# Patient Record
Sex: Female | Born: 1970 | Race: White | Hispanic: No | Marital: Married | Smoking: Never smoker
Health system: Southern US, Community
[De-identification: ages and names within clinical notes are randomized; demographics above are authoritative.]

## PROBLEM LIST (undated history)

## (undated) VITALS — BP 106/76 | HR 91 | Temp 98.3°F | Resp 18 | Ht 63.0 in | Wt 199.0 lb

## (undated) DIAGNOSIS — Z9889 Other specified postprocedural states: Secondary | ICD-10-CM

## (undated) DIAGNOSIS — F32A Depression, unspecified: Secondary | ICD-10-CM

## (undated) DIAGNOSIS — F329 Major depressive disorder, single episode, unspecified: Secondary | ICD-10-CM

## (undated) DIAGNOSIS — K219 Gastro-esophageal reflux disease without esophagitis: Secondary | ICD-10-CM

## (undated) DIAGNOSIS — R112 Nausea with vomiting, unspecified: Secondary | ICD-10-CM

## (undated) DIAGNOSIS — D649 Anemia, unspecified: Secondary | ICD-10-CM

## (undated) DIAGNOSIS — G43909 Migraine, unspecified, not intractable, without status migrainosus: Secondary | ICD-10-CM

## (undated) DIAGNOSIS — J329 Chronic sinusitis, unspecified: Secondary | ICD-10-CM

## (undated) DIAGNOSIS — C44509 Unspecified malignant neoplasm of skin of other part of trunk: Secondary | ICD-10-CM

## (undated) HISTORY — DX: Depression, unspecified: F32.A

## (undated) HISTORY — DX: Anemia, unspecified: D64.9

## (undated) HISTORY — DX: Migraine, unspecified, not intractable, without status migrainosus: G43.909

## (undated) HISTORY — DX: Gastro-esophageal reflux disease without esophagitis: K21.9

## (undated) HISTORY — PX: OTHER SURGICAL HISTORY: SHX169

## (undated) HISTORY — DX: Major depressive disorder, single episode, unspecified: F32.9

## (undated) HISTORY — DX: Unspecified malignant neoplasm of skin of other part of trunk: C44.509

## (undated) HISTORY — DX: Chronic sinusitis, unspecified: J32.9

---

## 1999-02-16 ENCOUNTER — Other Ambulatory Visit: Admission: RE | Admit: 1999-02-16 | Discharge: 1999-02-16 | Payer: Self-pay | Admitting: Obstetrics and Gynecology

## 1999-10-17 HISTORY — PX: CHOLECYSTECTOMY: SHX55

## 2000-05-24 ENCOUNTER — Other Ambulatory Visit: Admission: RE | Admit: 2000-05-24 | Discharge: 2000-05-24 | Payer: Self-pay | Admitting: Surgery

## 2000-05-24 ENCOUNTER — Encounter (INDEPENDENT_AMBULATORY_CARE_PROVIDER_SITE_OTHER): Payer: Self-pay | Admitting: Specialist

## 2000-07-25 ENCOUNTER — Encounter: Payer: Self-pay | Admitting: Obstetrics and Gynecology

## 2000-07-25 ENCOUNTER — Ambulatory Visit (HOSPITAL_COMMUNITY): Admission: RE | Admit: 2000-07-25 | Discharge: 2000-07-25 | Payer: Self-pay | Admitting: Obstetrics and Gynecology

## 2000-08-30 ENCOUNTER — Encounter: Payer: Self-pay | Admitting: Gastroenterology

## 2000-08-30 ENCOUNTER — Ambulatory Visit (HOSPITAL_COMMUNITY): Admission: RE | Admit: 2000-08-30 | Discharge: 2000-08-30 | Payer: Self-pay | Admitting: Gastroenterology

## 2001-01-08 ENCOUNTER — Encounter: Payer: Self-pay | Admitting: Obstetrics and Gynecology

## 2001-01-08 ENCOUNTER — Ambulatory Visit (HOSPITAL_COMMUNITY): Admission: RE | Admit: 2001-01-08 | Discharge: 2001-01-08 | Payer: Self-pay | Admitting: Obstetrics and Gynecology

## 2001-01-10 ENCOUNTER — Ambulatory Visit (HOSPITAL_COMMUNITY): Admission: RE | Admit: 2001-01-10 | Discharge: 2001-01-10 | Payer: Self-pay | Admitting: Obstetrics and Gynecology

## 2001-01-22 ENCOUNTER — Encounter: Payer: Self-pay | Admitting: Obstetrics and Gynecology

## 2001-01-22 ENCOUNTER — Ambulatory Visit (HOSPITAL_COMMUNITY): Admission: RE | Admit: 2001-01-22 | Discharge: 2001-01-22 | Payer: Self-pay | Admitting: Obstetrics and Gynecology

## 2001-01-23 ENCOUNTER — Encounter (INDEPENDENT_AMBULATORY_CARE_PROVIDER_SITE_OTHER): Payer: Self-pay

## 2001-01-23 ENCOUNTER — Ambulatory Visit (HOSPITAL_COMMUNITY): Admission: RE | Admit: 2001-01-23 | Discharge: 2001-01-23 | Payer: Self-pay | Admitting: Obstetrics and Gynecology

## 2001-08-09 ENCOUNTER — Encounter: Payer: Self-pay | Admitting: Obstetrics and Gynecology

## 2001-08-09 ENCOUNTER — Ambulatory Visit (HOSPITAL_COMMUNITY): Admission: RE | Admit: 2001-08-09 | Discharge: 2001-08-09 | Payer: Self-pay | Admitting: Obstetrics and Gynecology

## 2001-08-26 ENCOUNTER — Encounter: Payer: Self-pay | Admitting: Obstetrics and Gynecology

## 2001-08-26 ENCOUNTER — Ambulatory Visit (HOSPITAL_COMMUNITY): Admission: RE | Admit: 2001-08-26 | Discharge: 2001-08-26 | Payer: Self-pay | Admitting: Obstetrics and Gynecology

## 2001-08-28 ENCOUNTER — Ambulatory Visit (HOSPITAL_COMMUNITY): Admission: RE | Admit: 2001-08-28 | Discharge: 2001-08-28 | Payer: Self-pay | Admitting: Obstetrics and Gynecology

## 2001-08-28 ENCOUNTER — Encounter: Payer: Self-pay | Admitting: Obstetrics and Gynecology

## 2001-08-28 ENCOUNTER — Encounter (INDEPENDENT_AMBULATORY_CARE_PROVIDER_SITE_OTHER): Payer: Self-pay | Admitting: Specialist

## 2001-11-29 ENCOUNTER — Ambulatory Visit (HOSPITAL_COMMUNITY): Admission: RE | Admit: 2001-11-29 | Discharge: 2001-11-29 | Payer: Self-pay

## 2004-04-06 ENCOUNTER — Other Ambulatory Visit: Admission: RE | Admit: 2004-04-06 | Discharge: 2004-04-06 | Payer: Self-pay | Admitting: Obstetrics and Gynecology

## 2004-04-15 ENCOUNTER — Ambulatory Visit (HOSPITAL_COMMUNITY): Admission: RE | Admit: 2004-04-15 | Discharge: 2004-04-15 | Payer: Self-pay | Admitting: Obstetrics and Gynecology

## 2004-04-15 ENCOUNTER — Encounter (INDEPENDENT_AMBULATORY_CARE_PROVIDER_SITE_OTHER): Payer: Self-pay | Admitting: Specialist

## 2005-04-04 ENCOUNTER — Other Ambulatory Visit: Admission: RE | Admit: 2005-04-04 | Discharge: 2005-04-04 | Payer: Self-pay | Admitting: Obstetrics and Gynecology

## 2006-12-13 ENCOUNTER — Emergency Department (HOSPITAL_COMMUNITY): Admission: EM | Admit: 2006-12-13 | Discharge: 2006-12-13 | Payer: Self-pay | Admitting: Emergency Medicine

## 2006-12-24 ENCOUNTER — Ambulatory Visit: Payer: Self-pay | Admitting: Internal Medicine

## 2006-12-24 LAB — CONVERTED CEMR LAB: Potassium: 3.2 meq/L — ABNORMAL LOW (ref 3.5–5.1)

## 2007-02-15 ENCOUNTER — Ambulatory Visit: Payer: Self-pay | Admitting: Internal Medicine

## 2007-02-15 LAB — CONVERTED CEMR LAB
BUN: 4 mg/dL — ABNORMAL LOW (ref 6–23)
Creatinine, Ser: 0.6 mg/dL (ref 0.4–1.2)
Potassium: 3.1 meq/L — ABNORMAL LOW (ref 3.5–5.1)

## 2007-02-20 ENCOUNTER — Ambulatory Visit: Payer: Self-pay | Admitting: Internal Medicine

## 2007-02-20 LAB — CONVERTED CEMR LAB
PRA: 2.6
Potassium Urine: 57 mmol/L
Potassium: 3.7 meq/L (ref 3.5–5.1)

## 2007-03-14 ENCOUNTER — Encounter: Payer: Self-pay | Admitting: Internal Medicine

## 2007-03-14 ENCOUNTER — Ambulatory Visit: Payer: Self-pay | Admitting: Internal Medicine

## 2007-04-08 ENCOUNTER — Other Ambulatory Visit: Admission: RE | Admit: 2007-04-08 | Discharge: 2007-04-08 | Payer: Self-pay | Admitting: Obstetrics and Gynecology

## 2007-04-11 ENCOUNTER — Ambulatory Visit: Payer: Self-pay | Admitting: Gastroenterology

## 2007-04-12 ENCOUNTER — Encounter: Payer: Self-pay | Admitting: Internal Medicine

## 2007-04-12 ENCOUNTER — Ambulatory Visit: Payer: Self-pay | Admitting: Gastroenterology

## 2007-04-15 LAB — CONVERTED CEMR LAB
Amylase: 56 units/L (ref 27–131)
Bilirubin, Direct: 0.1 mg/dL (ref 0.0–0.3)
Eosinophils Absolute: 0.1 10*3/uL (ref 0.0–0.6)
Eosinophils Relative: 1 % (ref 0.0–5.0)
HCT: 38.5 % (ref 36.0–46.0)
Hemoglobin: 12.8 g/dL (ref 12.0–15.0)
MCV: 82.5 fL (ref 78.0–100.0)
Monocytes Relative: 6 % (ref 3.0–11.0)
Neutro Abs: 6 10*3/uL (ref 1.4–7.7)
Neutrophils Relative %: 69.1 % (ref 43.0–77.0)

## 2007-04-16 HISTORY — PX: ESOPHAGEAL DILATION: SHX303

## 2007-04-17 ENCOUNTER — Ambulatory Visit: Payer: Self-pay | Admitting: Internal Medicine

## 2007-05-06 ENCOUNTER — Ambulatory Visit (HOSPITAL_COMMUNITY): Admission: RE | Admit: 2007-05-06 | Discharge: 2007-05-06 | Payer: Self-pay | Admitting: Gastroenterology

## 2007-05-15 ENCOUNTER — Ambulatory Visit: Payer: Self-pay | Admitting: Gastroenterology

## 2007-08-12 ENCOUNTER — Ambulatory Visit: Payer: Self-pay | Admitting: Internal Medicine

## 2007-08-12 DIAGNOSIS — G43909 Migraine, unspecified, not intractable, without status migrainosus: Secondary | ICD-10-CM | POA: Insufficient documentation

## 2007-08-13 ENCOUNTER — Encounter (INDEPENDENT_AMBULATORY_CARE_PROVIDER_SITE_OTHER): Payer: Self-pay | Admitting: *Deleted

## 2007-08-13 LAB — CONVERTED CEMR LAB: Potassium: 3.8 meq/L (ref 3.5–5.1)

## 2007-08-27 ENCOUNTER — Ambulatory Visit: Payer: Self-pay | Admitting: Internal Medicine

## 2008-01-27 ENCOUNTER — Ambulatory Visit: Payer: Self-pay | Admitting: Internal Medicine

## 2008-01-27 DIAGNOSIS — K219 Gastro-esophageal reflux disease without esophagitis: Secondary | ICD-10-CM | POA: Insufficient documentation

## 2008-01-27 DIAGNOSIS — F329 Major depressive disorder, single episode, unspecified: Secondary | ICD-10-CM | POA: Insufficient documentation

## 2008-01-27 DIAGNOSIS — F3289 Other specified depressive episodes: Secondary | ICD-10-CM | POA: Insufficient documentation

## 2008-01-28 DIAGNOSIS — K222 Esophageal obstruction: Secondary | ICD-10-CM | POA: Insufficient documentation

## 2008-01-28 DIAGNOSIS — K3189 Other diseases of stomach and duodenum: Secondary | ICD-10-CM | POA: Insufficient documentation

## 2008-01-28 DIAGNOSIS — D1803 Hemangioma of intra-abdominal structures: Secondary | ICD-10-CM | POA: Insufficient documentation

## 2008-01-28 DIAGNOSIS — R1013 Epigastric pain: Secondary | ICD-10-CM

## 2008-01-30 ENCOUNTER — Encounter (INDEPENDENT_AMBULATORY_CARE_PROVIDER_SITE_OTHER): Payer: Self-pay | Admitting: *Deleted

## 2008-02-08 ENCOUNTER — Emergency Department (HOSPITAL_COMMUNITY): Admission: EM | Admit: 2008-02-08 | Discharge: 2008-02-09 | Payer: Self-pay | Admitting: Emergency Medicine

## 2008-02-12 ENCOUNTER — Ambulatory Visit: Payer: Self-pay | Admitting: Internal Medicine

## 2008-02-17 ENCOUNTER — Encounter: Payer: Self-pay | Admitting: Internal Medicine

## 2008-02-17 LAB — CONVERTED CEMR LAB
BUN: 9 mg/dL (ref 6–23)
Creatinine, Ser: 0.7 mg/dL (ref 0.4–1.2)

## 2008-02-19 ENCOUNTER — Encounter (INDEPENDENT_AMBULATORY_CARE_PROVIDER_SITE_OTHER): Payer: Self-pay | Admitting: *Deleted

## 2008-02-19 LAB — CONVERTED CEMR LAB: Potassium Urine: 49 mmol/L

## 2008-04-03 ENCOUNTER — Encounter: Payer: Self-pay | Admitting: Internal Medicine

## 2008-04-07 ENCOUNTER — Ambulatory Visit: Payer: Self-pay | Admitting: Internal Medicine

## 2008-04-15 ENCOUNTER — Telehealth (INDEPENDENT_AMBULATORY_CARE_PROVIDER_SITE_OTHER): Payer: Self-pay | Admitting: *Deleted

## 2008-04-16 ENCOUNTER — Encounter: Payer: Self-pay | Admitting: Internal Medicine

## 2008-04-16 ENCOUNTER — Encounter (INDEPENDENT_AMBULATORY_CARE_PROVIDER_SITE_OTHER): Payer: Self-pay | Admitting: *Deleted

## 2008-05-07 ENCOUNTER — Ambulatory Visit: Payer: Self-pay | Admitting: Internal Medicine

## 2008-05-12 ENCOUNTER — Encounter (INDEPENDENT_AMBULATORY_CARE_PROVIDER_SITE_OTHER): Payer: Self-pay | Admitting: *Deleted

## 2008-07-08 ENCOUNTER — Ambulatory Visit: Payer: Self-pay | Admitting: Internal Medicine

## 2008-08-19 ENCOUNTER — Ambulatory Visit: Payer: Self-pay | Admitting: Internal Medicine

## 2008-09-21 ENCOUNTER — Other Ambulatory Visit: Admission: RE | Admit: 2008-09-21 | Discharge: 2008-09-21 | Payer: Self-pay | Admitting: Obstetrics and Gynecology

## 2008-11-03 ENCOUNTER — Ambulatory Visit: Payer: Self-pay | Admitting: Internal Medicine

## 2008-11-03 DIAGNOSIS — G479 Sleep disorder, unspecified: Secondary | ICD-10-CM | POA: Insufficient documentation

## 2008-12-09 ENCOUNTER — Encounter (INDEPENDENT_AMBULATORY_CARE_PROVIDER_SITE_OTHER): Payer: Self-pay | Admitting: *Deleted

## 2009-02-08 ENCOUNTER — Ambulatory Visit: Payer: Self-pay | Admitting: Internal Medicine

## 2009-04-06 ENCOUNTER — Telehealth (INDEPENDENT_AMBULATORY_CARE_PROVIDER_SITE_OTHER): Payer: Self-pay | Admitting: *Deleted

## 2009-04-07 ENCOUNTER — Encounter: Payer: Self-pay | Admitting: Internal Medicine

## 2009-05-07 ENCOUNTER — Encounter (INDEPENDENT_AMBULATORY_CARE_PROVIDER_SITE_OTHER): Payer: Self-pay | Admitting: *Deleted

## 2009-05-13 ENCOUNTER — Telehealth (INDEPENDENT_AMBULATORY_CARE_PROVIDER_SITE_OTHER): Payer: Self-pay | Admitting: *Deleted

## 2009-05-25 ENCOUNTER — Ambulatory Visit: Payer: Self-pay | Admitting: Internal Medicine

## 2009-10-28 ENCOUNTER — Encounter: Payer: Self-pay | Admitting: Internal Medicine

## 2009-11-01 ENCOUNTER — Ambulatory Visit: Payer: Self-pay | Admitting: Internal Medicine

## 2009-11-01 DIAGNOSIS — R5381 Other malaise: Secondary | ICD-10-CM | POA: Insufficient documentation

## 2009-11-01 DIAGNOSIS — R5383 Other fatigue: Secondary | ICD-10-CM

## 2009-11-03 LAB — CONVERTED CEMR LAB
ALT: 14 units/L (ref 0–35)
BUN: 6 mg/dL (ref 6–23)
Bilirubin, Direct: 0.1 mg/dL (ref 0.0–0.3)
Free T4: 0.7 ng/dL (ref 0.6–1.6)
Hemoglobin: 12.7 g/dL (ref 12.0–15.0)
Lymphocytes Relative: 25.8 % (ref 12.0–46.0)
MCHC: 31.8 g/dL (ref 30.0–36.0)
Neutro Abs: 3.8 10*3/uL (ref 1.4–7.7)
Neutrophils Relative %: 65.6 % (ref 43.0–77.0)
RDW: 13.2 % (ref 11.5–14.6)
Total Bilirubin: 0.5 mg/dL (ref 0.3–1.2)

## 2009-12-06 ENCOUNTER — Encounter: Payer: Self-pay | Admitting: Internal Medicine

## 2009-12-29 ENCOUNTER — Other Ambulatory Visit: Admission: RE | Admit: 2009-12-29 | Discharge: 2009-12-29 | Payer: Self-pay | Admitting: Obstetrics and Gynecology

## 2010-01-02 ENCOUNTER — Inpatient Hospital Stay (HOSPITAL_COMMUNITY)
Admission: AD | Admit: 2010-01-02 | Discharge: 2010-01-02 | Payer: Self-pay | Source: Home / Self Care | Admitting: Obstetrics and Gynecology

## 2010-01-02 ENCOUNTER — Ambulatory Visit: Payer: Self-pay | Admitting: Obstetrics and Gynecology

## 2010-01-14 ENCOUNTER — Inpatient Hospital Stay (HOSPITAL_COMMUNITY): Admission: AD | Admit: 2010-01-14 | Discharge: 2010-01-14 | Payer: Self-pay | Admitting: Obstetrics and Gynecology

## 2010-01-14 ENCOUNTER — Ambulatory Visit: Payer: Self-pay | Admitting: Obstetrics and Gynecology

## 2010-04-26 ENCOUNTER — Telehealth: Payer: Self-pay | Admitting: Internal Medicine

## 2010-04-27 ENCOUNTER — Encounter: Payer: Self-pay | Admitting: Internal Medicine

## 2010-06-01 ENCOUNTER — Ambulatory Visit: Payer: Self-pay | Admitting: Internal Medicine

## 2010-06-27 ENCOUNTER — Inpatient Hospital Stay (HOSPITAL_COMMUNITY): Admission: AD | Admit: 2010-06-27 | Discharge: 2010-06-27 | Payer: Self-pay | Admitting: Obstetrics and Gynecology

## 2010-06-29 ENCOUNTER — Inpatient Hospital Stay (HOSPITAL_COMMUNITY): Admission: AD | Admit: 2010-06-29 | Discharge: 2010-07-01 | Payer: Self-pay | Admitting: Obstetrics and Gynecology

## 2010-06-29 ENCOUNTER — Encounter (INDEPENDENT_AMBULATORY_CARE_PROVIDER_SITE_OTHER): Payer: Self-pay | Admitting: Obstetrics and Gynecology

## 2010-07-02 ENCOUNTER — Encounter: Admission: RE | Admit: 2010-07-02 | Discharge: 2010-07-06 | Payer: Self-pay | Admitting: Obstetrics and Gynecology

## 2010-08-17 ENCOUNTER — Encounter: Payer: Self-pay | Admitting: Internal Medicine

## 2010-09-12 ENCOUNTER — Telehealth: Payer: Self-pay | Admitting: Internal Medicine

## 2010-09-12 ENCOUNTER — Encounter: Payer: Self-pay | Admitting: Internal Medicine

## 2010-09-15 ENCOUNTER — Telehealth: Payer: Self-pay | Admitting: Internal Medicine

## 2010-11-03 ENCOUNTER — Ambulatory Visit
Admission: RE | Admit: 2010-11-03 | Discharge: 2010-11-03 | Payer: Self-pay | Source: Home / Self Care | Attending: Internal Medicine | Admitting: Internal Medicine

## 2010-11-06 ENCOUNTER — Encounter: Payer: Self-pay | Admitting: Obstetrics and Gynecology

## 2010-11-13 LAB — CONVERTED CEMR LAB
AST: 15 units/L (ref 0–37)
Albumin: 3.7 g/dL (ref 3.5–5.2)
Amylase: 60 units/L (ref 27–131)
Basophils Absolute: 0 10*3/uL (ref 0.0–0.1)
Basophils Relative: 0.1 % (ref 0.0–1.0)
Eosinophils Absolute: 0.1 10*3/uL (ref 0.0–0.7)
Eosinophils Relative: 0.8 % (ref 0.0–5.0)
Lipase: 18 units/L (ref 11.0–59.0)
Lymphocytes Relative: 21 % (ref 12.0–46.0)
Monocytes Relative: 5.8 % (ref 3.0–12.0)
Neutro Abs: 5.4 10*3/uL (ref 1.4–7.7)
Potassium: 3.2 meq/L — ABNORMAL LOW (ref 3.5–5.1)
WBC: 7.5 10*3/uL (ref 4.5–10.5)

## 2010-11-17 NOTE — Letter (Signed)
Summary: [facility]  [facility]   Imported By: Lanelle Bal 11/08/2009 09:45:05  _____________________________________________________________________  External Attachment:    Type:   Image     Comment:   External Document

## 2010-11-17 NOTE — Medication Information (Signed)
Summary: Denial for Relpax/[facility]  Denial for Relpax/[facility]   Imported By: Lanelle Bal 09/27/2010 08:50:45  _____________________________________________________________________  External Attachment:    Type:   Image     Comment:   External Document

## 2010-11-17 NOTE — Medication Information (Signed)
Summary: Approved  Nexium / [facility]  Approved  Nexium / [facility]   Imported By: Lennie Odor 05/12/2010 11:35:49  _____________________________________________________________________  External Attachment:    Type:   Image     Comment:   External Document

## 2010-11-17 NOTE — Medication Information (Signed)
Summary: Letter Regarding Sertraline & Relpax/[facility]  Letter Regarding Sertraline & Relpax/[facility]   Imported By: Lanelle Bal 09/26/2010 10:27:39  _____________________________________________________________________  External Attachment:    Type:   Image     Comment:   External Document

## 2010-11-17 NOTE — Progress Notes (Signed)
Summary: Relpax denial  Phone Note Call from Patient   Summary of Call: I spoke with [facility] and the patient will be able to receive her meds (Relpax). Due to patient not exceeding the allowed qty the prior authorization was denied. The only reason pharmacy received a prior authorization request was b/c the refill was being ran a few days too early. They will fax a copy of the denial letter anyway. Initial call taken by: Lucious Groves CMA,  September 15, 2010 10:49 AM

## 2010-11-17 NOTE — Assessment & Plan Note (Signed)
Summary: med check/cbs   Vital Signs:  Patient profile:   40 year old female Weight:      222.50 pounds (101.14 kg) Temp:     97.7 degrees F (36.50 degrees C) oral Resp:     15 per minute BP sitting:   116 / 64  (left arm) Cuff size:   regular  Vitals Entered By: Lucious Groves CMA (June 01, 2010 8:25 AM) CC: Med check./kb Is Patient Diabetic? No Pain Assessment Patient in pain? no      Comments Patient notes that due to pregnancy, the only med she is taking is Nexiukm. Lucious Groves CMA  June 01, 2010 8:26 AM    CC:  Med check./kb.  History of Present Illness: She has come off all meds due to pregnancy documented  as of late February  @  her 7th week based on [place] . She did not believe she could get maintain  a preganancy based on 4 prior pregnancies which ended with misscarriages. Fertility tests on her & her husband also suggested deformed oviducts & abnormally low  sperm count. She is now [redacted] weeks pregnant & under the care of Delice Bison Cole,MD.She is having no issues with this pregnancy , but the child ( girl) is small. She has had 5 USs & is seen every 2 weeks.She plans to breast feed. She  returns to school 08/19.  Current Medications (verified): 1)  Relpax 40 Mg  Tabs (Eletriptan Hydrobromide) .Marland Kitchen.. 1 Once Daily As Needed Migraine 2)  Transderm-Scop 1.5 Mg  Pt72 (Scopolamine Base) .Marland Kitchen.. 1 Q 3days Prn 3)  Nexium 40 Mg  Cpdr (Esomeprazole Magnesium) .Marland Kitchen.. 1 By Mouth Qd 4)  Cymbalta 60 Mg Cpep (Duloxetine Hcl) .Marland Kitchen.. 1 Once Daily 5)  Lamictal 25 Mg Tabs (Lamotrigine) .Marland Kitchen.. 1 Once Daily  Allergies (verified): 1)  ! Sulfa  Review of Systems General:  Complains of fatigue; Weight up only 5# with pregnancy. ENT:  Denies difficulty swallowing and hoarseness. CV:  Denies palpitations, swelling of feet, and swelling of hands. GI:  Complains of indigestion and nausea; denies abdominal pain; Nexium controls dyspepsia  "for the most part"; generic Reglan also Rxed by Ob/Gyn. Psych:  Denies  anxiety, depression, easily angered, easily tearful, irritability, and panic attacks; Dr Kellie Moor seen monthly.  Physical Exam  General:  well-nourished; alert,appropriate and cooperative throughout examination Eyes:  No corneal or conjunctival inflammation noted.  Perrla.No lid lag Neck:  No deformities, masses, or tenderness noted. Lungs:  Normal respiratory effort, chest expands symmetrically. Lungs are clear to auscultation, no crackles or wheezes. Heart:  normal rate, regular rhythm, no gallop, no rub, no JVD, and grade 1/2  /6 systolic murmur LSB.   Extremities:  No clubbing, cyanosis, edema. Neurologic:  alert & oriented X3 and DTRs symmetrical and normal.  No tremor Skin:  Intact without suspicious lesions or rashes Psych:  memory intact for recent and remote, normally interactive, good eye contact, not anxious appearing, and not depressed appearing.     Impression & Recommendations:  Problem # 1:  DEPRESSIVE DISORDER NOT ELSEWHERE CLASSIFIED (ICD-311) Stable off meds The following medications were removed from the medication list:    Cymbalta 60 Mg Cpep (Duloxetine hcl) .Marland Kitchen... 1 once daily  Problem # 2:  PREGNANCY (ICD-V22.2) as per Dr Richardson Dopp  Problem # 3:  GERD (ICD-530.81)  Her updated medication list for this problem includes:    Nexium 40 Mg Cpdr (Esomeprazole magnesium) .Marland Kitchen... 1 by mouth qd  Complete Medication List: 1)  Relpax 40 Mg Tabs (Eletriptan hydrobromide) .Marland Kitchen.. 1 once daily as needed migraine 2)  Transderm-scop 1.5 Mg Pt72 (Scopolamine base) .Marland Kitchen.. 1 q 3days prn 3)  Nexium 40 Mg Cpdr (Esomeprazole magnesium) .Marland Kitchen.. 1 by mouth qd 4)  Lamictal 25 Mg Tabs (Lamotrigine) .Marland Kitchen.. 1 once daily  Patient Instructions: 1)  Discuss your concerns about potential  post partum  issues with Dr Richardson Dopp. 2)  Avoid foods high in acid (tomatoes, citrus juices, spicy foods). Avoid eating within two hours of lying down or before exercising. Do not over eat; try smaller more frequent meals.  Elevate head of bed twelve inches when sleeping.

## 2010-11-17 NOTE — Medication Information (Signed)
[facility]   Imported By: Lanelle Bal 12/09/2009 08:48:53  _____________________________________________________________________  External Attachment:    Type:   Image     Comment:   External Document

## 2010-11-17 NOTE — Assessment & Plan Note (Signed)
Summary: talk about migraines///sph   Vital Signs:  Patient profile:   40 year old female Weight:      214.4 pounds Temp:     98.5 degrees F oral Pulse rate:   72 / minute BP sitting:   104 / 78  (left arm) Cuff size:   large  Vitals Entered By: Shonna Chock CMA (November 03, 2010 10:25 AM) CC: Migraines worse, Headaches   CC:  Migraines worse and Headaches.  History of Present Illness:      This is a 40 year old woman who presents with  increased frequency of migraine headaches in past month, now @ least once daily or to 2 daily.  The patient reports nausea and photophobia, but denies vomiting, sweats, tearing of eyes, nasal congestion, sinus pain, sinus pressure, and phonophobia.  The headache is described as intermittent, throbbing, and band-like when it moves anteriorly .  The  initial location of the pain is occipital.  High-risk features (red flags) include vision loss or change, "I see something peripherally which is not there".  The patient denies the following high-risk features: fever, neck pain/stiffness, focal weakness, altered mental status, rash, trauma, new type of headache ( just more frequent), immunosuppression, and concomitant infection.  The headaches are  NOT precipitated by any specific trigger.  Prior treatment has included a triptan with benefit but insurance allows only 8/month.She denies use of NSAIDS.Dizziness is Prodrome ; Excedrin Migraine has not helped.    Current Medications (verified): 1)  Relpax 40 Mg  Tabs (Eletriptan Hydrobromide) .Marland Kitchen.. 1 Once Daily As Needed Migraine 2)  Nexium 40 Mg  Cpdr (Esomeprazole Magnesium) .Marland Kitchen.. 1 By Mouth Qd  Allergies: 1)  ! Sulfa  Physical Exam  General:  well-nourished,in no acute distress; alert  and cooperative throughout examination Eyes:  No corneal or conjunctival inflammation noted. EOMI. Perrla. Field of Vision grossly normal. Ears:  External ear exam shows no significant lesions or deformities.  Otoscopic  examination reveals clear canals, tympanic membranes are intact bilaterally without bulging, retraction, inflammation or discharge. Hearing is grossly normal bilaterally. Nose:  External nasal examination shows no deformity or inflammation. Nasal mucosa are pink and moist without lesions or exudates. Mouth:  Oral mucosa and oropharynx without lesions or exudates.  No tongue deviation Heart:  Normal rate and regular rhythm. S1 and S2 normal without gallop, murmur, click, rub . S4 with slurring Pulses:  R and L carotid  pulses are full and equal bilaterally w/o bruits  Extremities:  No clubbing, cyanosis, edema. Neurologic:  alert & oriented X3, cranial nerves II-XII intact, strength normal in all extremities, sensation intact to light touch, gait normal, DTRs symmetrical and normal, finger-to-nose normal, heel-to-shin normal, and Romberg negative.   Skin:  Intact without suspicious lesions or rashes Cervical Nodes:  No lymphadenopathy noted Axillary Nodes:  No palpable lymphadenopathy Psych:  memory intact for recent and remote, flat affect, and subdued.     Impression & Recommendations:  Problem # 1:  MIGRAINE (ICD-346.90)  increased frequency w/o definitive cause Her updated medication list for this problem includes:    Relpax 40 Mg Tabs (Eletriptan hydrobromide) .Marland Kitchen... 1 once daily as needed migraine  Orders: Headache Clinic Referral (Headache)  Complete Medication List: 1)  Relpax 40 Mg Tabs (Eletriptan hydrobromide) .Marland Kitchen.. 1 once daily as needed migraine 2)  Nexium 40 Mg Cpdr (Esomeprazole magnesium) .Marland Kitchen.. 1 by mouth qd 3)  Topiragen 25 Mg Tabs (Topiramate) .Marland Kitchen.. 1 at bedtime week 1 then 1 two times a  day week 2; then 1 am & 2 at bedtime x1 week then 2 two times a day 4)  Gabapentin 100 Mg Caps (Gabapentin) .Marland Kitchen.. 1 every 8 hrs as needed for headache  Patient Instructions: 1)  Keep Headache Diary as discussed Prescriptions: GABAPENTIN 100 MG CAPS (GABAPENTIN) 1 every 8 hrs as needed for  headache  #30 x 1   Entered and Authorized by:   Marga Melnick MD   Signed by:   Marga Melnick MD on 11/03/2010   Method used:   Print then Give to Patient   RxID:   [id number] TOPIRAGEN 25 MG TABS (TOPIRAMATE) 1 at bedtime week 1 then 1 two times a day week 2; then 1 am & 2 at bedtime X1 week then 2 two times a day  #70 x 0   Entered and Authorized by:   Marga Melnick MD   Signed by:   Marga Melnick MD on 11/03/2010   Method used:   Print then Give to Patient   RxID:   [id number]    Orders Added: 1)  Est. Patient Level IV [60630] 2)  Headache Clinic Referral [Headache]

## 2010-11-17 NOTE — Progress Notes (Signed)
Summary: Prior Auth--Relpax  Phone Note Refill Request Call back at [phone] Message from:  Pharmacy on September 12, 2010 8:43 AM  Refills Requested: Medication #1:  RELPAX 40 MG  TABS 1 once daily as needed migraine   Dosage confirmed as above?Dosage Confirmed   Supply Requested: 1 month   Last Refilled: 08/15/2010 [facility] [address]  Next Appointment Scheduled: none Initial call taken by: Harold Barban,  September 12, 2010 8:44 AM  Follow-up for Phone Call        Forms requested. Lucious Groves CMA  September 12, 2010 8:56 AM   Forms received, ?does the patient require more than 320mg /30 days, ?how many HA per month. Left message on voicemail to call back to office Lucious Groves CMA  September 12, 2010 10:51 AM   Additional Follow-up for Phone Call Additional follow up Details #1::        I spoke with the patient and she notes that #8 tabs per month is sufficient and she has a minimum/average of 3-4 headaches per month.  Forms completed and faxed. Will await insurance company reply. Additional Follow-up by: Lucious Groves CMA,  September 12, 2010 4:31 PM     Appended Document: Prior Auth--Relpax I called automated system to check on status and PA has been denied. Requested denial letter.

## 2010-11-17 NOTE — Assessment & Plan Note (Signed)
Summary: needs medication change/nta   Vital Signs:  Patient profile:   40 year old female Weight:      221.2 pounds Temp:     98.2 degrees F oral Pulse rate:   98 / minute Resp:     17 per minute BP sitting:   120 / 78  (left arm) Cuff size:   large  Vitals Entered By: Shonna Chock (November 01, 2009 3:03 PM) CC: Discuss Cymbalta and Relpax Comments REVIEWED MED LIST, PATIENT AGREED DOSE AND INSTRUCTION CORRECT    CC:  Discuss Cymbalta and Relpax.  History of Present Illness: Dr Leonor Liv seen last on 10/25/2009; depression is resistant. He recommended Lamictil be started ; she is on Cymbalta with slight benefit.Abilify caused "jitteriness".He will see her next Monday, 01/24. Recent migraine flare with weather change; she knows her food triggers.Faint rash R posterior neck since 10/30/2009, ? bite.Rx: Benadryl.  Preventive Screening-Counseling & Management  Caffeine-Diet-Exercise     Does Patient Exercise: no  Allergies: 1)  ! Sulfa  Family History: F depression,skin CA,ulcer;M uncle died @ 11 colon CA; M depression Family History Ovarian cancer, CAD PGM  MGM  CVA, alcoholism; MGGM ; Alcoholism bro, MGM,MGF. 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 Psych:  Complains of depression, easily tearful, and suicidal thoughts/plans; denies anxiety, easily angered, irritability, and panic attacks; No specific plan / mode for suicide.Marland Kitchen  Physical Exam  General:  well-nourished,in no acute distress;  flat affect but  cooperative throughout examination Eyes:  No corneal or conjunctival inflammation noted. No lid lag. Perrla. Neck:  No deformities, masses, or tenderness noted. Lungs:  Normal respiratory effort, chest expands  symmetrically. Lungs are clear to auscultation, no crackles or wheezes. Heart:  Normal rate and regular rhythm. S1  normal without gallop, murmur, click, rub. S2 accentuated Neurologic:  DTRs symmetrical and normal.  No tremor Skin:  Faint rash R posterior neck w/o vesicles or skin disruption Cervical Nodes:  No lymphadenopathy noted Axillary Nodes:  No palpable lymphadenopathy Psych:  depressed affect, flat affect, and subdued.  Poor eye contact   Impression & Recommendations:  Problem # 1:  DEPRESSIVE DISORDER NOT ELSEWHERE CLASSIFIED (ICD-311)  Her updated medication list for this problem includes:    Cymbalta 60 Mg Cpep (Duloxetine hcl) .Marland Kitchen... 1 once daily  Orders: Venipuncture (16109)  Problem # 2:  FATIGUE (ICD-780.79)  Orders: Venipuncture (60454) TLB-CBC Platelet - w/Differential (85025-CBCD) TLB-Hepatic/Liver Function Pnl (80076-HEPATIC) TLB-TSH (Thyroid Stimulating Hormone) (84443-TSH) TLB-Creatinine, Blood (82565-CREA) TLB-Potassium (K+) (84132-K) TLB-BUN (Urea Nitrogen) (84520-BUN) TLB-T4 (Thyrox), Free [phone])  Problem # 3:  RASH-NONVESICULAR (ICD-782.1)  Complete Medication List: 1)  Relpax 40 Mg Tabs (Eletriptan hydrobromide) .Marland Kitchen.. 1 once daily as needed migraine 2)  Transderm-scop 1.5 Mg Pt72 (Scopolamine base) .Marland Kitchen.. 1 q 3days prn 3)  Nexium 40 Mg Cpdr (Esomeprazole magnesium) .Marland Kitchen.. 1 by mouth qd 4)  Cymbalta 60 Mg Cpep (Duloxetine hcl) .Marland Kitchen.. 1 once daily 5)  Lamictal 25 Mg Tabs (Lamotrigine) .Marland Kitchen.. 1 once daily  Patient Instructions: 1)   Lamictil will be started after rash gone & will be  titrated under Dr Avita [place] guidance. Cortaid two times a day to neck rash until gone. Prescriptions: LAMICTAL 25 MG TABS (LAMOTRIGINE) 1 once daily  #30 x 0   Entered and Authorized by:  Marga Melnick MD   Signed by:   Marga Melnick MD on 11/01/2009   Method used:   Print then Give to Patient   RxID:   [id number]

## 2010-11-17 NOTE — Progress Notes (Signed)
Summary: Nexium PA  Phone Note Refill Request Message from:  Fax from Pharmacy on April 26, 2010 4:51 PM  Refills Requested: Medication #1:  NEXIUM 40 MG  CPDR 1 by mouth qd prior auth phone [phone]  Initial call taken by: Okey Regal Spring,  April 26, 2010 4:52 PM  Follow-up for Phone Call        ID: [id number] Spoke with [facility] and this will be a renewal, requested form. Case: [id number]. Paula Horne  April 26, 2010 5:04 PM   Additional Follow-up for Phone Call Additional follow up Details #1::        Form completed and faxed, will await insurance company reply. Additional Follow-up by: Paula Horne,  April 27, 2010 11:09 AM

## 2010-11-17 NOTE — Medication Information (Signed)
Summary: Prior Authorization for Nexium/[facility]  Prior Authorization for Nexium/[facility]   Imported By: Lanelle Bal 05/04/2010 11:02:07  _____________________________________________________________________  External Attachment:    Type:   Image     Comment:   External Document

## 2010-11-17 NOTE — Medication Information (Signed)
Summary: Prior Authorization for Relpax/[facility]  Prior Authorization for Relpax/[facility]   Imported By: Lanelle Bal 09/22/2010 14:18:12  _____________________________________________________________________  External Attachment:    Type:   Image     Comment:   External Document

## 2010-12-06 ENCOUNTER — Encounter: Payer: Self-pay | Admitting: Internal Medicine

## 2010-12-22 NOTE — Letter (Signed)
[facility]   Imported By: Maryln Gottron 12/13/2010 12:58:47  _____________________________________________________________________  External Attachment:    Type:   Image     Comment:   External Document

## 2010-12-29 LAB — CBC
HCT: 23.9 % — ABNORMAL LOW (ref 36.0–46.0)
Hemoglobin: 8.1 g/dL — ABNORMAL LOW (ref 12.0–15.0)
MCH: 26.8 pg (ref 26.0–34.0)
MCHC: 34 g/dL (ref 30.0–36.0)
MCHC: 32.9 g/dL (ref 30.0–36.0)
MCV: 81.7 fL (ref 78.0–100.0)
Platelets: 239 10*3/uL (ref 150–400)
RBC: 3.64 MIL/uL — ABNORMAL LOW (ref 3.87–5.11)
RDW: 14.4 % (ref 11.5–15.5)
WBC: 7.5 10*3/uL (ref 4.0–10.5)

## 2010-12-29 LAB — RPR: RPR Ser Ql: NONREACTIVE

## 2011-01-04 LAB — URINALYSIS, ROUTINE W REFLEX MICROSCOPIC

## 2011-01-04 LAB — URINE MICROSCOPIC-ADD ON

## 2011-01-05 ENCOUNTER — Ambulatory Visit (HOSPITAL_COMMUNITY): Payer: BC Managed Care – PPO

## 2011-01-05 ENCOUNTER — Other Ambulatory Visit (HOSPITAL_COMMUNITY): Payer: Self-pay | Admitting: Obstetrics and Gynecology

## 2011-01-05 ENCOUNTER — Other Ambulatory Visit (HOSPITAL_COMMUNITY)
Admission: RE | Admit: 2011-01-05 | Discharge: 2011-01-05 | Disposition: A | Discharge status: Home / Self Care | Payer: BC Managed Care – PPO | Source: Ambulatory Visit | Attending: Obstetrics and Gynecology | Admitting: Obstetrics and Gynecology

## 2011-01-05 ENCOUNTER — Other Ambulatory Visit: Payer: Self-pay | Admitting: Obstetrics and Gynecology

## 2011-01-05 ENCOUNTER — Ambulatory Visit (HOSPITAL_COMMUNITY)
Admission: RE | Admit: 2011-01-05 | Discharge: 2011-01-05 | Disposition: A | Discharge status: Home / Self Care | Payer: BC Managed Care – PPO | Source: Ambulatory Visit | Attending: Obstetrics and Gynecology | Admitting: Obstetrics and Gynecology

## 2011-01-05 DIAGNOSIS — Z113 Encounter for screening for infections with a predominantly sexual mode of transmission: Secondary | ICD-10-CM | POA: Insufficient documentation

## 2011-01-05 DIAGNOSIS — Z01419 Encounter for gynecological examination (general) (routine) without abnormal findings: Secondary | ICD-10-CM | POA: Insufficient documentation

## 2011-01-05 DIAGNOSIS — O368390 Maternal care for abnormalities of the fetal heart rate or rhythm, unspecified trimester, not applicable or unspecified: Secondary | ICD-10-CM | POA: Insufficient documentation

## 2011-01-05 DIAGNOSIS — O09299 Supervision of pregnancy with other poor reproductive or obstetric history, unspecified trimester: Secondary | ICD-10-CM | POA: Insufficient documentation

## 2011-01-05 DIAGNOSIS — O3680X0 Pregnancy with inconclusive fetal viability, not applicable or unspecified: Secondary | ICD-10-CM

## 2011-02-17 ENCOUNTER — Inpatient Hospital Stay (INDEPENDENT_AMBULATORY_CARE_PROVIDER_SITE_OTHER)
Admission: RE | Admit: 2011-02-17 | Discharge: 2011-02-17 | Disposition: A | Discharge status: Home / Self Care | Payer: Self-pay | Source: Ambulatory Visit | Attending: Emergency Medicine | Admitting: Emergency Medicine

## 2011-02-17 ENCOUNTER — Ambulatory Visit (INDEPENDENT_AMBULATORY_CARE_PROVIDER_SITE_OTHER): Payer: BC Managed Care – PPO

## 2011-02-17 ENCOUNTER — Encounter (HOSPITAL_COMMUNITY): Payer: Self-pay

## 2011-02-17 DIAGNOSIS — IMO0002 Reserved for concepts with insufficient information to code with codable children: Secondary | ICD-10-CM

## 2011-02-17 DIAGNOSIS — S63509A Unspecified sprain of unspecified wrist, initial encounter: Secondary | ICD-10-CM

## 2011-02-22 ENCOUNTER — Other Ambulatory Visit (HOSPITAL_COMMUNITY): Payer: Self-pay | Admitting: Obstetrics and Gynecology

## 2011-02-22 ENCOUNTER — Ambulatory Visit (HOSPITAL_COMMUNITY)
Admission: RE | Admit: 2011-02-22 | Discharge: 2011-02-22 | Disposition: A | Discharge status: Home / Self Care | Payer: BC Managed Care – PPO | Source: Ambulatory Visit | Attending: Obstetrics and Gynecology | Admitting: Obstetrics and Gynecology

## 2011-02-22 DIAGNOSIS — Z3689 Encounter for other specified antenatal screening: Secondary | ICD-10-CM

## 2011-02-22 DIAGNOSIS — O358XX0 Maternal care for other (suspected) fetal abnormality and damage, not applicable or unspecified: Secondary | ICD-10-CM | POA: Insufficient documentation

## 2011-02-22 DIAGNOSIS — O09529 Supervision of elderly multigravida, unspecified trimester: Secondary | ICD-10-CM | POA: Insufficient documentation

## 2011-02-22 DIAGNOSIS — O09299 Supervision of pregnancy with other poor reproductive or obstetric history, unspecified trimester: Secondary | ICD-10-CM | POA: Insufficient documentation

## 2011-02-22 DIAGNOSIS — Z1389 Encounter for screening for other disorder: Secondary | ICD-10-CM | POA: Insufficient documentation

## 2011-02-22 DIAGNOSIS — Z363 Encounter for antenatal screening for malformations: Secondary | ICD-10-CM | POA: Insufficient documentation

## 2011-02-28 NOTE — Assessment & Plan Note (Signed)
[facility]                         GASTROENTEROLOGY OFFICE NOTE   [name], [name]                     MRN:          [id number]  DATE:04/11/2007                            DOB:          04/06/1971    OFFICE NOTE   PROBLEM:  Abdominal pain.  Paula Horne is a 40 year old white female referred through the courtesy  of Dr. Alwyn Ren for evaluation.  For several months, she has been  complaining of epigastric and mostly right upper quadrant pain.  The  pain may radiate around to her back on both sides.  It is unrelated to  eating and unaffected by eating.  She occasionally will awaken with  pain.  It may last hours at a time.  She also complains of pyrosis,  which is well-controlled with Nexium.  She does have dysphagia to  solids.  She is on no gastric irritants, including nonsteroidals.  She  underwent cholecystectomy in 2001.  Recent CT scan demonstrated bowel  duct measuring 12 mm.  Liver tests in February were normal.  She was  seen in the ER for evaluation of abdominal pain.  She has had no fevers  or chills.   PAST MEDICAL HISTORY:  Pertinent for low potassium.  She has depression  and suffers from migraines, and has a history of skin cancer.   MEDICATIONS:  Nexium 40 mg twice a day.   ALLERGIES:  SULFA.   SOCIAL HISTORY:  She neither smokes nor drinks.  She is married and  works as a sixth Scientist, product/process development.   REVIEW OF SYSTEMS:  Positive for back pain and sleeping problems.   EXAM:  Pulse 60, blood pressure 98/66, weight 203.  HEENT:  EOMI. PERRLA. Sclerae are anicteric.  Conjunctivae are pink.  NECK:  Supple without thyromegaly, adenopathy or carotid bruits.  CHEST:  Clear to auscultation and percussion without adventitious  sounds.  CARDIAC:  Regular rhythm; normal S1 S2.  There are no murmurs, gallops  or rubs.  ABDOMEN:  Bowel sounds are normoactive.  Abdomen is soft, non-tender and  non-distended.  There are no abdominal  masses, tenderness, splenic  enlargement or hepatomegaly.  EXTREMITIES:  Full range of motion.  No cyanosis, clubbing or edema.  RECTAL:  Deferred.   IMPRESSION:  1. Right upper quadrant abdominal pain.  This could be due to ulcer      and non-ulcer dyspepsia.  A retained bile duct stone is also      consideration in view of her moderate bile duct dilatation.  2. Gastroesophageal reflux disease.  3. Dysphagia - rule out early esophageal stricture.   RECOMMENDATIONS:  1. Upper endoscopy with dilatation as indicated.  2. Repeat LFTs and check amylase and lipase.  3. Abdominal ultrasound.     Barbette Hair. Arlyce Dice, MD,FACG  Electronically Signed    RDK/MedQ  DD: 04/11/2007  DT: 04/11/2007  Job #: [id number]   cc:   Titus Dubin. Alwyn Ren, MD,FACP,FCCP

## 2011-02-28 NOTE — Letter (Signed)
April 11, 2007    Titus Dubin. Alwyn Ren, MD,FACP,FCCP  [phone] [place], [place] [address]   RE:  Paula Horne, Paula Horne  MRN:  [id number]  /  DOB:  13-Oct-1971   Dear Dr. Alwyn Ren:   Upon your kind referral, I had the pleasure of evaluating your patient  and I am pleased to offer my findings.  I saw Paula Horne in the office  today.  Enclosed is a copy of my progress note that details my findings  and recommendations.   Thank you for the opportunity to participate in your patient's care.    Sincerely,      Barbette Hair. Arlyce Dice, MD,FACG  Electronically Signed    RDK/MedQ  DD: 04/11/2007  DT: 04/11/2007  Job #: [id number]

## 2011-02-28 NOTE — Assessment & Plan Note (Signed)
[facility]                         GASTROENTEROLOGY OFFICE NOTE   NANCYJO, GIVHAN                     MRN:          [id number]  DATE:05/15/2007                            DOB:          [birth year]    PROBLEM:  1. Abdominal pain.  2. Dysphagia.   Ms. Fluellen has returned for scheduled GI followup.  Upper endoscopy  demonstrated a distal esophageal stricture for which she was dilated to  18 mm.  Abdominal ultrasound and MRI demonstrated a 2 sonometer right  lobe liver hemangioma.  Liver tests remain totally normal.   Ms. Ourada is having no further episodes of abdominal pain, at this point  she has no GI complaints.   EXAMINATION:  Pulse 58, blood pressure 108/76, weight 206.   IMPRESSION:  1. Dyspepsia -- resolved with Nexium.  2. 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  3. Incidental hepatic hemangioma.   RECOMMENDATIONS:  No further GI workup or therapy at this point.     Barbette Hair. Arlyce Dice, MD,FACG  Electronically Signed    RDK/MedQ  DD: 05/15/2007  DT: 05/16/2007  Job #: [id number]   cc:   Dr. Alwyn Ren

## 2011-02-28 NOTE — Letter (Signed)
April 11, 2007    Richarda Osmond. Conde   RE:  MALASIA, TORAIN  MRN:  [id number]  /  DOB:  [birth year]   Dear Mrs. Vera:   It is my pleasure to have treated you recently as a new patient in my  office.  I appreciate your confidence and the opportunity to participate  in your care.   Since I do have a busy inpatient endoscopy schedule and office schedule,  my office hours vary weekly.  I am, however, available for emergency  calls every day through my office.  If I cannot promptly meet an urgent  office appointment, another one of our gastroenterologists will be able  to assist you.   My well-trained staff are prepared to help you at all times.  For  emergencies after office hours, a physician from our gastroenterology  section is always available through my 24-hour answering service.   While you are under my care, I encourage discussion of your questions  and concerns, and I will be happy to return your calls as soon as I am  available.   Once again, I welcome you as a new patient and I look forward to a happy  and healthy relationship.    Sincerely,      Barbette Hair. Arlyce Dice, MD,FACG  Electronically Signed   RDK/MedQ  DD: 04/11/2007  DT: 04/11/2007  Job #: [id number]

## 2011-03-03 NOTE — Op Note (Signed)
[facility]  Patient:    Paula Horne, Paula Horne                       MRN: [id number] Proc. Date: 01/23/01 Adm. Date:  [id number] Attending:  Madelyn Flavors                           Operative Report  PREOPERATIVE DIAGNOSIS:       Missed abortion at 7-6/7 weeks.  POSTOPERATIVE DIAGNOSIS:      Missed abortion at 7-6/7 weeks.  PROCEDURE:                    Dilatation and evacuation.  SURGEON:                      Beather Arbour. Thomasena Edis, M.D., Ph.D.  ANESTHESIA:                   Monitored anesthesia care plus 10 cc                               1% lidocaine paracervical block.  ESTIMATED BLOOD LOSS:         Minimal.  FLUIDS:                       1000 cc of crystalloid.  COMPLICATIONS:                None.  DRAINS:                       None.  DESCRIPTION OF PROCEDURE:     The patient was brought to the operating room and ______ on the operating room table. After the patient was adequately sedated with monitored anesthesia care, she was placed in a dorsal lithotomy position and prepped and draped in the usual sterile fashion. The bladder was straight catheterized for approximately 50 cc of clear yellow urine. The bimanual examination was performed. The uterus was noted to be 8 weeks, mobile without any adnexal mass palpated. The speculum was placed and the anterior lip of the cervix was infiltrated with 1 cc of 1% lidocaine and grasped with a single-tooth tenaculum. The remaining 9 cc of 1% lidocaine were placed for a paracervical block. The cervix was very carefully and gently dilated up to a #25 Pratt dilator that curved to fit #7 curved suction curet which was placed into the endometrial cavity. The uterus was systematically curetted in a systematic clockwise fashion with copious tissue and fluid obtained. The uterus was then explored using the sharp curet and only a small amount of decidual tissue was obtained. There is noted to be very minimal  bleeding. The curved suction curet was again placed and the endometrial cavity was suctioned free of clots. No additional tissue was obtained. A good cry was heard all around. At that point the procedure was then terminated. The tenaculum was removed. There was noted to be no bleeding from the tenaculum site. The patient tolerated the procedure well without apparent complications and was transferred to the recovery room in stable condition after all instrument, sponge, and needle counts were correct. The patient is urged to return to the office in two weeks for a postoperative evaluation. She has been given prescriptions for Methergine 0.2 mg q.6h. for two days to  decrease bleeding. She is to call for heavy bleeding such that soaking up a large pad an hour for two consecutive hours. In addition, she was given a prescription for doxycycline 100 mg p.o. b.i.d. to take for seven days. She may take Ibuprofen 400 to 600 mg q.6h. p.r.n. pain. She will call with any problems. DD:  01/23/01 TD:  01/23/01 Job: [id number]

## 2011-03-03 NOTE — H&P (Signed)
[facility]  Patient:    Paula Horne, Paula Horne Visit Number: [id number] MRN: [id number]          Service Type: DSU Location: DAY Attending Physician:  Madelyn Flavors Dictated by:   Beather Arbour Thomasena Edis, M.D. Admit Date:  08/28/2001   CC:         Julieanne Manson, M.D.  [facility]   History and Physical  HISTORY OF PRESENT ILLNESS:  The patient is a 40 year old, G2, P58 Caucasian female admitted for D&E for a blighted ovum.  The patient had an ultrasound two weeks ago when she was found to have a positive pregnancy test which showed an intrauterine pregnancy.  Followup ultrasound performed two weeks later revealed no growth and a collapsing sac consistent with a nonviable pregnancy.  The patient unfortunately had a fetal demise at 7-6/7 weeks in April.  With this pregnancy, a progesterone level was checked and found to be excellent at 43.8.  Her quantitative beta HCGs continued to rise.  Ultrasound did show possible hydropic changes consistent with a possible molar pregnancy. We did contact the patients insurance company to ascertain if we could send the products of conception for chromosomes and we were told that no precertification was required.  In addition, we will consider a work-up for antiphospholipid syndrome due to these two demises.  Risks of surgery including anesthetic complication, hemorrhage infection, damage to adjacent structures including bladder, bowel, blood vessels and ureter were discussed with the patient.  She was made aware of uterine perforation which could result in a life threatening hemorrhage requiring emergent hysterectomy.  She was also made aware of the risk of uterine perforation which could result in bowel damage resulting in overwhelming, life threatening peritonitis which could result in emergent colostomy.  She expressed understanding of and acceptance of these risks and desires to proceed.  PAST  MEDICAL HISTORY: 1. Migraine headaches followed by Dr. Shireen Quan. 2. History of hypokalemia most recently followed by Dr. Julieanne Manson and    found to have resolved. 3. Leg surgery at age 40 due to a spider bite. 4. Right foot surgery in 1991 and 1992.  ALLERGIES:  SULFA and TYLENOL.  FAMILY HISTORY:  The patients mother is 59, alive and well.  Father 24 with basal cell skin cancer and kidney problems.  One brother, age 40, alive and well.  MEDICATION:  Prenatal vitamins.  REVIEW OF SYSTEMS:  Negative.  PHYSICAL EXAMINATION:  VITAL SIGNS:  Blood pressure 104/72, weight 195, heart rate 68.  HEENT:  Normal.  NECK:  Supple without thyromegaly.  LUNGS:  Clear to auscultation bilaterally.  CARDIAC:  Regular rate and rhythm.  ABDOMEN:  Soft, nontender.  No hepatosplenomegaly or masses.  PELVIC:  Uterus is approximately 6-8 weeks, anteverted and nontender without any adnexal mass palpated.  ASSESSMENT/PLAN:  The patient is a 40 year old, Caucasian female, G2, P0 admitted for dilatation and evacuation.  This pregnancy has been achieved on Clomid, but unfortunately, her two-week ultrasound did show no growth in the pregnancy and a collapsing sac.  The patients blood type is known to be A positive, thus RhoGAM is not required.  All her questions were answered. Dictated by:   Beather Arbour Thomasena Edis, M.D. Attending Physician:  Madelyn Flavors DD:  08/28/01 TD:  08/28/01 Job: [id number]

## 2011-03-03 NOTE — Op Note (Signed)
[facility]  Patient:    Paula Horne, Paula Horne                       MRN: [id number] Proc. Date: 01/23/01 Adm. Date:  [id number] Attending:  Madelyn Flavors                           Operative Report  ADDENDUM:                     The patients blood type is A positive, thus RhoGAM is not indicated. In addition, once I received the patients records from my former office, I found that she does have a history of hypokalemia and has been hospitalized for this. No etiology has been found and the patient does not an internal medicine physician. Potassium today is 3.2. She states she is allergic to bananas and will not drink Gatorade. We will then refer her at her postoperative exam to an internist to be evaluated for hypokalemia. DD:  01/23/01 TD:  01/24/01 Job: [id number]

## 2011-03-03 NOTE — Op Note (Signed)
NAME:  Paula Horne, Paula Horne                        ACCOUNT NO.:  [id number]   MEDICAL RECORD NO.:  [id number]                   PATIENT TYPE:  AMB   LOCATION:  SDC                                  FACILITY:  [facility]   PHYSICIAN:  Charles A. Sydnee Cabal, MD            DATE OF BIRTH:  [birth year]   DATE OF PROCEDURE:  04/15/2004  DATE OF DISCHARGE:                                 OPERATIVE REPORT   PREOPERATIVE DIAGNOSES:  1. Eight-week fetal demise.  2. Recurrent abortion.   POSTOPERATIVE DIAGNOSES:  1. Eight-week fetal demise.  2. Recurrent abortion.   INDICATIONS:  The patient to be admitted for D&E secondary to eight-week  demise.  She declined medical management.  She accepts the risks of  infection, bleeding, uterine perforation, blood product risk including  hepatitis and HIV exposure, retained products with second D&C.  All  questions were answered.   PROCEDURES:  1. Paracervical block.  2. Dilation and evacuation, eight weeks.   SURGEON:  Charles A. Delcambre, MD   COMPLICATIONS:  None.   ESTIMATED BLOOD LOSS:  25 mL.   OPERATIVE FINDINGS:  Eight to 10-week uterus.  Products of conception  consistent with pregnancy as noted.   ANESTHESIA:  Monitored anesthesia care, paracervical block.   SPECIMENS:  Products of conception to pathology.   DESCRIPTION OF PROCEDURE:  The patient was taken to the operating room and  placed in the supine position and then the dorsal lithotomy position in  universal stirrups, and sterile prep and drape was undertaken.  A single-  tooth tenaculum was placed on the anterior cervix.  A paracervical block  using 7 mL divided at 4 o'clock and 8 o'clock, 0.25% Marcaine was placed  without difficulty.  There was no evidence of intravascular injection.  A  sound was gently passed.  No evidence of perforation was noted.  The sound  was to about 8-9 cm.  Pratt dilators were used to dilate up to a [employer] 22.  This allowed passage of the 8 mm curette,  curved suction curette.  Pressure  was then set at 50 and circumferential curetting was undertaken with the  suction curette.  A moderate amount of tissue was aspirated in the first two  passes.  The third pass removed whitish to yellowish decidual-appearing  tissue and a fourth pass, no further tissue.  Gentle exploration with a  banjo curette yielded no further tissue.  The procedure was  terminated, the tenaculum was removed, Toradol was given per anesthesia.  Of  note, preoperatively this patient had a potassium drawn as she states she is  chronically low.  The potassium returned 2.9.  We will ensure follow-up with  her primary care Jazion Atteberry to investigate chronic hypokalemia.  As this is  chronic replacement is not done today.  Charles A. Sydnee Cabal, MD    CAD/MEDQ  D:  04/15/2004  T:  04/16/2004  Job:  [id number]

## 2011-03-03 NOTE — Consult Note (Signed)
[facility]  Patient:    Paula Horne, Paula Horne Visit Number: [id number] MRN: [id number]          Service Type: DSU Location: DAY Attending Physician:  Madelyn Flavors Dictated by:   Beather Arbour Thomasena Edis, M.D., Ph.D. Proc. Date: 08/28/01 Admit Date:  08/28/2001                            Consultation Report  PREOPERATIVE DIAGNOSES: 1. Blighted ovum, possible molar pregnancy as evidence by hydropic changes. 2. Collapsing gestational sac consistent with nonviable pregnancy.  POSTOPERATIVE DIAGNOSES: 1. Blighted ovum, possible molar pregnancy as evidence by hydropic changes. 2. Collapsing gestational sac consistent with nonviable pregnancy.  PROCEDURE:  Dilatation and evacuation.  SURGEON:  Beather Arbour. Thomasena Edis, M.D., Ph.D.  ANESTHESIA:  General.  DRAINS:  None.  COMPLICATIONS:  None.  FLUIDS:  Approximately 2000 cc of crystalloid and 20 units of Pitocin and IV fluid.  ESTIMATED BLOOD LOSS:  Minimal.  DESCRIPTION OF PROCEDURE:  The patient is brought to the operating room, identified on the operating room table.  After induction of adequate general endotracheal anesthesia, the patient is placed in a dorsolithotomy position and prepped and draped in the usual sterile fashion.  The bladder was straight cathed for approximately 350 cc of clear yellow urine.  The uterus was noted to be approximately 8 weeks size and anteverted.  A speculum was placed, and the anterior lip of the cervix was grasped with a single tooth tenaculum.  The cervix was very gently dilated up to a #25 Pratt dilator.  The cervix was initially unable to accommodate the #8 suction curette, so I placed a #7 curved curette, and copious tissue was obtained.  Because I wanted to send this for chromosomes, I had to initially suction out some of the tissue, and one tubing and send this for chromosomal analysis which had to be sterile and could not therefore be suctioned into the suction  container.  After tissues for chromosomes was collected, the curette was again placed, and additional tissue was removed.  Copious tissue was removed.  I then attempted sharp curettage, but could ascertain that there was an area where there appeared to be more tissue, however, I was unable to get the suction curette to suction this tissue into the tubing.  I then tried a #8 curette, gently placed, with no additional tissue obtained.  Subsequently, it was this surgeons decision to obtain an intraoperative ultrasound to make sure that all of the tissue had been removed.  It was deemed that this was prudent to decrease the risk of uterine perforation, and also to decrease the risk of subsequent bleeding for incomplete procedure.  Ultrasound revealed there to be a small amount of tissue.  The curette was again placed, and the additional tissue was obtained. The uterus was noted to be extremely anteroflexed.  Using ultrasound guidance, the remainder of the tissue was suctioned without difficulty, such that at the end of the procedure there was a good endometrial stripe seen with total collapse of the uterine walls.  There was noted to be a good cry all around with the sharp and the curved suction curette.  At that point, the procedure was then terminated.  It was deemed that all of the tissue had been removed. The patient tolerated the procedure well without apparent complications, was transferred to the recovery room in stable condition, after all instrument, sponge, and needle counts were correct.  The patient had been previously given Methergine to take 0.2 mg q.6h. for two days, doxycycline 100 mg p.o. b.i.d. x 7 days, and a prescription for Darvocet-N 100 one or two p.o. q.4-6h. p.r.n. pain.  Blood type is A+.  RhoGAM is not required.  The patient will refrain from intercourse, return to the office in two weeks for a postoperative evaluation.  I also sent a lupus workup, as the patient has  had two gestations which appeared to be progressing normally, and all of a sudden became nonviable at the 2 to 3 week followup ultrasound. Dictated by:   Beather Arbour Thomasena Edis, M.D., Ph.D. Attending Physician:  Madelyn Flavors DD:  08/28/01 TD:  08/28/01 Job: [id number]

## 2011-03-03 NOTE — H&P (Signed)
[facility]  Patient:    Paula Horne, Paula Horne                       MRN: [id number] Adm. Date:  01/23/01 Attending:  Beather Arbour. Thomasena Edis, M.D.                         History and Physical  DATE OF BIRTH:                [birth year]  HISTORY OF PRESENT ILLNESS:   The patient is a 40 year old gravida 1, para 0, Caucasian female admitted for D&E for a fetal demise at 7-6/7 weeks.  The patient has been treated with Clomid for a history of infertility and conceived on her third cycle of Clomid.  Her LMP is November 16, 2000.  She was followed with quantitative beta HCGs which doubled appropriately.  Her initial ultrasound was on January 08, 2001, which showed a single intrauterine embryo living at 7-4/7 weeks.  However, subsequently, a few days later, the patient experienced some spotting, was found to have A-positive blood type and scheduled for an ultrasound.  Ultrasound performed January 22, 2001, unfortunately showed a fetal demise.  Patient is admitted for dilatation and evacuation.  Risks of surgery including anesthetic complication, hemorrhage, infection, damage to adjacent structures including bladder, bowel, blood vessels, ureters were discussed with the patient.  She was made aware of the risks of uterine perforation which could result in ______ life-threatening hemorrhage requiring emergent hysterectomy or uterine perforation resulting in bowel damage which could require emergent colostomy or result in ______ life-threatening peritonitis.  She expresses understanding of and acceptance of these risks.  Furthermore, I have explained to both the patient and her husband that this is not their fault, that this is, unfortunately, a very common occurrence and I have offered them the comfort team.  PAST MEDICAL HISTORY:         Patient has a history of migraine headaches and sees Dr. Fransisca Connors.  At one point, the patient had hypokalemia.  Leg surgery at 39  years of age due to a spider bite.  Right foot surgery in 1991 to 1992.  ALLERGIES:                    SULFA and TYLENOL.  FAMILY HISTORY:               Paternal grandfather died of bone cancer, father has skin cancer.  Paternal grandmother with skin cancer, maternal grandmother with lung cancer.  MEDICATIONS:                  Prenatal vitamins.  REVIEW OF SYSTEMS:            Negative.  PHYSICAL EXAMINATION:  HEENT:                        Normal.  NECK:                         Supple without thyromegaly.  LUNGS:                        Clear to auscultation.  CARDIAC:                      Regular rate and rhythm.  ABDOMEN:  Soft, nontender.  No hepatosplenomegaly.  PELVIC:                       Uterus is approximately 9 weeks without any adnexal masses palpated.  It is mobile and nontender.  No bleeding is noted.  ASSESSMENT AND PLAN:          Patient is a 40 year old, gravida 1, para 0 who conceived on Clomid, was found to have rising quantitative beta human chorionic gonadotropin appropriately but with a subsequent fetal demise on follow-up ultrasound after noting first trimester spotting.  Blood type is A-positive.  Patient is admitted for a dilatation and evacuation.  All risks have been explained to the patient and her husband who desire to proceed with the dilatation and evacuation. DD:  01/23/01 TD:  01/23/01 Job: [id number]

## 2011-03-03 NOTE — Assessment & Plan Note (Signed)
[facility]                        [place] OFFICE NOTE   Paula Horne, Paula Horne                     MRN:          [id number]  DATE:12/24/2006                            DOB:          [birth year]    Paula Horne was seen as a new patient on December 24, 2006 for abdominal  pain.  She was seen in the emergency room on December 13, 2006 for these  symptoms which have been present for several weeks.  It is described as  epigastric pain with radiation around and to the back.  The pain is  postprandial.  Intermittently, she has had some loose stool, for which  she took Imodium.  She has been unable to maintain a normal diet,  finding only pudding to be tolerable.  She has also noted some dysphagia  with bread intake over the past week.  She has taken Reglan, with some  improvement in her symptoms.   She denies any exertional cardiopulmonary symptoms, although she has had  some heaviness in the chest in the postprandial period.   She has no genitourinary symptoms.   Significantly, she had a cholecystectomy in 2001.  She is unsure of why  the surgery was done, and she does not remember whether the symptoms  were similar to the present illness.  She does have a history of  intermittent low potassium.  She is gravida 4, para 0.  She has also had  D&Cs following miscarriages.  She has a history of migraines for which  she uses Excedrin as needed.  She has a history of basal cell  precancerous lesion.   Maternal uncle had Crohn's disease and colon cancer and died at 50.  Father had a rare form of skin cancer and ulcers.  Maternal grandmother  had ovarian cancer and coronary artery disease.  Maternal great-  grandmother had stroke in her 13s.   She has never smoked and does not drink.   She is presently on metoclopramide 10 mg q.4 h. as needed, and Nexium 40  mg daily as noted from the emergency room visit.   She does not drink and is not on a  regular exercise program.  She has  discontinued caffeine intake, without change in her symptoms.   REVIEW OF SYSTEMS:  Positive for fatigue, sleep disturbance, and  myalgias.   PHYSICAL EXAMINATION:  VITAL SIGNS:  She is 5 feet 4 inches, weight is  207, pulse 68 and regular, respiratory rate 18, blood pressure 120/76.  HEENT:  She has no scleral icterus or jaundice.  Oropharynx reveals some  retained matter in the left tonsil, but no significant erythema.  There  is some edema in the posterior pharynx.  SKIN:  Warm and dry.  CHEST:  Clear to auscultation.  CARDIOVASCULAR:  There are no significant murmurs.  All pulses are  intact.  There is no edema.  ABDOMEN:  She is tender over the abdomen, particularly in the right  upper quadrant.   The records from [facility] ER were reviewed.  CT scans of the abdomen and  pelvis were negative.  Her lab reports  were normal, with the exception  of a potassium of 3 and glucose of 102.  Specifically, her hematocrit  was 37.5, white count of 6800.  Although she has no genitourinary  symptoms, the urine revealed rare bacteria.  Lipase was normal.   The potassium will be rechecked along with amylase and lipase.   She will be asked to avoid the triggers for reflux, which would include  the Excedrin and peppermint.  She will be asked not to eat within 3  hours of going to bed.  She will be asked to take the Nexium 30 minutes  before breakfast and 30 minutes before the evening meal.  Because of the  persistent nature of these symptoms and lack of response to appropriate  interventions by her and the dysphagia, referral to gastroenterology  will be pursued.  Stool cards will also be collected.     Titus Dubin. Alwyn Ren, MD,FACP,FCCP  Electronically Signed    [facility]/MedQ  DD: 12/24/2006  DT: 12/24/2006  Job #: [id number]

## 2011-04-11 ENCOUNTER — Other Ambulatory Visit: Payer: Self-pay | Admitting: Internal Medicine

## 2011-05-19 ENCOUNTER — Other Ambulatory Visit: Payer: Self-pay | Admitting: Internal Medicine

## 2011-05-19 MED ORDER — ESOMEPRAZOLE MAGNESIUM 40 MG PO CPDR: 40.0000 mg | DELAYED_RELEASE_CAPSULE | Freq: Every day | ORAL | Status: DC

## 2011-05-19 NOTE — Telephone Encounter (Signed)
RX sent to pharmacy  

## 2011-05-26 NOTE — Telephone Encounter (Signed)
Called left msg for pt informed prior auth still in process and we are still waiting to hear response informed that if samples are needed and that they can be placed up front if needed

## 2011-05-26 NOTE — Telephone Encounter (Signed)
Patient called back---would like samples--will be here today on Friday at 3:00 or after

## 2011-05-26 NOTE — Telephone Encounter (Signed)
Patient says she has contacted pharmacy and Nexium prescription has not been filled---she says pharmacy says prescription needs prior auth---if our request was received on Monday 8/3, shouldn't we have something about prior auth by now??

## 2011-05-26 NOTE — Telephone Encounter (Signed)
Information received from [employer] medication approved from 05/05/11-05/25/2012

## 2011-06-01 ENCOUNTER — Encounter: Payer: Self-pay | Admitting: Internal Medicine

## 2011-06-01 ENCOUNTER — Ambulatory Visit (INDEPENDENT_AMBULATORY_CARE_PROVIDER_SITE_OTHER): Payer: BC Managed Care – PPO | Admitting: Internal Medicine

## 2011-06-01 DIAGNOSIS — Z331 Pregnant state, incidental: Secondary | ICD-10-CM

## 2011-06-01 DIAGNOSIS — J209 Acute bronchitis, unspecified: Secondary | ICD-10-CM

## 2011-06-01 MED ORDER — AMOXICILLIN 500 MG PO CAPS: 500.0000 mg | ORAL_CAPSULE | Freq: Three times a day (TID) | ORAL | Status: AC

## 2011-06-01 MED ORDER — BENZONATATE 100 MG PO CAPS: 100.0000 mg | ORAL_CAPSULE | Freq: Three times a day (TID) | ORAL | Status: DC | PRN

## 2011-06-01 NOTE — Progress Notes (Signed)
  Subjective:    Patient ID: Paula Horne, female    DOB: [birth year], 40 y.o.   MRN: [id number]  HPI Cough Onset:2 weeks ago as dry cough. Her 17 month  daughter had an upper respiratory tract infection which resolved with amoxicillin. Extrinsic symptoms:itchy eyes, sneezing:no  Infectious symptoms :fever, purulent secretions :no Chest symptoms: pain, sputum production, hemoptysis,dyspnea,wheezing:no GI symptoms: Dyspepsia, reflux:some component as [redacted] weeks pregnant Occupational/environmental exposures:no Smoking:never ACE inhibitor:no Treatment/efficacy:allergy OTC med, Robitussin, honey & hot tea. Zpack last week from Gyn w/o benefit Past medical history/family history pulmonary disease: mother has mild  asthma    Review of Systems The major and minor symptoms of rhinosinusitis were reviewed. She denies nasal congestion/obstruction; nasal purulence; facial pain; anosmia; fatigue; fever; headache; halitosis; earache and dental pain.   Cough is disturbing sleep      Objective:   Physical Exam General appearance is of good health and nourishment; no acute distress or increased work of breathing is present.  No  lymphadenopathy about the head, neck, or axilla noted. Obviously pregnant  Eyes: No conjunctival inflammation or lid edema is present. There is no scleral icterus.  Ears:  External ear exam shows no significant lesions or deformities.  Otoscopic examination reveals clear canals, tympanic membranes are intact bilaterally without bulging, retraction, inflammation or discharge.  Nose:  External nasal examination shows no deformity or inflammation. Nasal mucosa are pink and moist without lesions or exudates. No septal dislocation or dislocation.No obstruction to airflow.   Oral exam: Dental hygiene is good; lips and gums are healthy appearing.There is no oropharyngeal erythema or exudate noted.   Heart:  Normal rate and regular rhythm. S1 and S2 normal without gallop,  click, rub  or other extra sounds.  Low grade raspy grade 1/2-1 systolic murmur  Lungs:Chest clear to auscultation; no wheezes, rhonchi,rales ,or rubs present.No increased work of breathing.  BSdecreased  Extremities:  No cyanosis, edema, or clubbing  noted    Skin: Warm & dry w/o jaundice or tenting.          Assessment & Plan:  #1 bronchitis, acute. Suboptimal response to Z-Pak  #2 intrauterine pregnancy, 33 weeks  #3 true sulfa allergy  #4 not breast-feeding.  Plan: See orders and recommendations.

## 2011-06-01 NOTE — Patient Instructions (Signed)
Plain Mucinex for thick secretions ;force NON dairy fluids for next 48 hrs. Use a Neti pot daily as needed for sinus congestion 

## 2011-06-26 ENCOUNTER — Encounter (HOSPITAL_COMMUNITY): Payer: Self-pay | Admitting: Anesthesiology

## 2011-06-26 ENCOUNTER — Inpatient Hospital Stay (HOSPITAL_COMMUNITY)
Admission: AD | Admit: 2011-06-26 | Discharge: 2011-06-28 | DRG: 372 | Disposition: A | Discharge status: Home / Self Care | Payer: BC Managed Care – PPO | Source: Ambulatory Visit | Attending: Obstetrics and Gynecology | Admitting: Obstetrics and Gynecology

## 2011-06-26 ENCOUNTER — Inpatient Hospital Stay (HOSPITAL_COMMUNITY): Payer: BC Managed Care – PPO | Admitting: Anesthesiology

## 2011-06-26 ENCOUNTER — Encounter (HOSPITAL_COMMUNITY): Payer: Self-pay | Admitting: *Deleted

## 2011-06-26 DIAGNOSIS — O09529 Supervision of elderly multigravida, unspecified trimester: Secondary | ICD-10-CM | POA: Diagnosis present

## 2011-06-26 DIAGNOSIS — D62 Acute posthemorrhagic anemia: Secondary | ICD-10-CM | POA: Diagnosis present

## 2011-06-26 DIAGNOSIS — IMO0002 Reserved for concepts with insufficient information to code with codable children: Principal | ICD-10-CM | POA: Diagnosis present

## 2011-06-26 DIAGNOSIS — J209 Acute bronchitis, unspecified: Secondary | ICD-10-CM

## 2011-06-26 DIAGNOSIS — O9902 Anemia complicating childbirth: Secondary | ICD-10-CM | POA: Diagnosis present

## 2011-06-26 HISTORY — DX: Nausea with vomiting, unspecified: Z98.890

## 2011-06-26 HISTORY — DX: Nausea with vomiting, unspecified: R11.2

## 2011-06-26 LAB — ABO/RH

## 2011-06-26 LAB — URIC ACID: Uric Acid, Serum: 5.2 mg/dL (ref 2.4–7.0)

## 2011-06-26 LAB — URINALYSIS, DIPSTICK ONLY
Nitrite: NEGATIVE
Specific Gravity, Urine: 1.025 (ref 1.005–1.030)
Urobilinogen, UA: 0.2 mg/dL (ref 0.0–1.0)

## 2011-06-26 LAB — COMPREHENSIVE METABOLIC PANEL
Chloride: 102 mEq/L (ref 96–112)
GFR calc Af Amer: >60 mL/min (ref >60)
GFR calc non Af Amer: >60 mL/min (ref >60)
Potassium: 4 mEq/L (ref 3.5–5.1)
Sodium: 136 mEq/L (ref 135–145)
Total Protein: 5.5 g/dL — ABNORMAL LOW (ref 6.0–8.3)

## 2011-06-26 LAB — CBC
MCHC: 28.3 g/dL — ABNORMAL LOW (ref 30.0–36.0)
RDW: 15.7 % — ABNORMAL HIGH (ref 11.5–15.5)

## 2011-06-26 LAB — RPR: RPR: NONREACTIVE

## 2011-06-26 LAB — ANTIBODY SCREEN: Antibody Screen: NEGATIVE

## 2011-06-26 LAB — RUBELLA ANTIBODY, IGM: Rubella: IMMUNE

## 2011-06-26 LAB — STREP B DNA PROBE: GBS: NEGATIVE

## 2011-06-26 MED ORDER — LACTATED RINGERS IV SOLN: 500.0000 mL | INTRAVENOUS | Status: DC | PRN

## 2011-06-26 MED ORDER — OXYTOCIN 20 UNITS IN LACTATED RINGERS INFUSION - SIMPLE
1.0000 m[IU]/min | INTRAVENOUS | Status: DC
  Filled 2011-06-26: qty 1000

## 2011-06-26 MED ORDER — EPHEDRINE 5 MG/ML INJ: 10.0000 mg | INTRAVENOUS | Status: DC | PRN

## 2011-06-26 MED ORDER — ACETAMINOPHEN 325 MG PO TABS: 650.0000 mg | ORAL_TABLET | Freq: Four times a day (QID) | ORAL | Status: DC | PRN

## 2011-06-26 MED ORDER — LACTATED RINGERS IV SOLN
INTRAVENOUS | Status: DC
  Administered 2011-06-26: 18:00:00 via INTRAVENOUS

## 2011-06-26 MED ORDER — FENTANYL 2.5 MCG/ML BUPIVACAINE 1/10 % EPIDURAL INFUSION (WH - ANES)
INTRAMUSCULAR | Status: DC | PRN
  Administered 2011-06-26: 14 mL/h via EPIDURAL

## 2011-06-26 MED ORDER — OXYCODONE-ACETAMINOPHEN 5-325 MG PO TABS
2.0000 | ORAL_TABLET | ORAL | Status: DC | PRN
  Administered 2011-06-27 (×3): 1 via ORAL
  Administered 2011-06-27: 2 via ORAL
  Administered 2011-06-28: 1 via ORAL
  Filled 2011-06-26 (×3): qty 1
  Filled 2011-06-26: qty 2
  Filled 2011-06-26: qty 1

## 2011-06-26 MED ORDER — TERBUTALINE SULFATE 1 MG/ML IJ SOLN: 0.2500 mg | Freq: Once | INTRAMUSCULAR | Status: AC | PRN

## 2011-06-26 MED ORDER — CITRIC ACID-SODIUM CITRATE 334-500 MG/5ML PO SOLN: 30.0000 mL | ORAL | Status: DC | PRN

## 2011-06-26 MED ORDER — IBUPROFEN 600 MG PO TABS: 600.0000 mg | ORAL_TABLET | Freq: Four times a day (QID) | ORAL | Status: DC | PRN

## 2011-06-26 MED ORDER — IBUPROFEN 600 MG PO TABS
600.0000 mg | ORAL_TABLET | Freq: Four times a day (QID) | ORAL | Status: DC | PRN
  Administered 2011-06-27: 600 mg via ORAL
  Filled 2011-06-26 (×6): qty 1

## 2011-06-26 MED ORDER — LIDOCAINE HCL 1.5 % IJ SOLN
INTRAMUSCULAR | Status: DC | PRN
  Administered 2011-06-26 (×2): 4 mL via EPIDURAL

## 2011-06-26 MED ORDER — FLEET ENEMA 7-19 GM/118ML RE ENEM: 1.0000 | ENEMA | RECTAL | Status: DC | PRN

## 2011-06-26 MED ORDER — LIDOCAINE HCL (PF) 1 % IJ SOLN: 30.0000 mL | INTRAMUSCULAR | Status: DC | PRN

## 2011-06-26 MED ORDER — ACETAMINOPHEN 325 MG PO TABS: 650.0000 mg | ORAL_TABLET | ORAL | Status: DC | PRN

## 2011-06-26 MED ORDER — DIPHENHYDRAMINE HCL 50 MG/ML IJ SOLN: 12.5000 mg | INTRAMUSCULAR | Status: DC | PRN

## 2011-06-26 MED ORDER — ONDANSETRON HCL 4 MG/2ML IJ SOLN: 4.0000 mg | Freq: Four times a day (QID) | INTRAMUSCULAR | Status: DC | PRN

## 2011-06-26 MED ORDER — DINOPROSTONE 10 MG VA INST
10.0000 mg | VAGINAL_INSERT | Freq: Once | VAGINAL | Status: AC
  Administered 2011-06-26: 10 mg via VAGINAL
  Filled 2011-06-26: qty 1

## 2011-06-26 MED ORDER — SODIUM CHLORIDE 0.9 % IJ SOLN: 3.0000 mL | INTRAMUSCULAR | Status: DC | PRN

## 2011-06-26 MED ORDER — FENTANYL 2.5 MCG/ML BUPIVACAINE 1/10 % EPIDURAL INFUSION (WH - ANES)
14.0000 mL/h | INTRAMUSCULAR | Status: DC
  Filled 2011-06-26: qty 60

## 2011-06-26 MED ORDER — OXYTOCIN BOLUS FROM INFUSION
500.0000 mL | Freq: Once | INTRAVENOUS | Status: DC
  Filled 2011-06-26: qty 500

## 2011-06-26 MED ORDER — FENTANYL 2.5 MCG/ML BUPIVACAINE 1/10 % EPIDURAL INFUSION (WH - ANES): 14.0000 mL/h | INTRAMUSCULAR | Status: DC

## 2011-06-26 MED ORDER — EPHEDRINE 5 MG/ML INJ
10.0000 mg | INTRAVENOUS | Status: DC | PRN
  Filled 2011-06-26: qty 4

## 2011-06-26 MED ORDER — OXYTOCIN 20 UNITS IN LACTATED RINGERS INFUSION - SIMPLE: 125.0000 mL/h | Freq: Once | INTRAVENOUS | Status: DC

## 2011-06-26 MED ORDER — PHENYLEPHRINE 40 MCG/ML (10ML) SYRINGE FOR IV PUSH (FOR BLOOD PRESSURE SUPPORT): 80.0000 ug | PREFILLED_SYRINGE | INTRAVENOUS | Status: DC | PRN

## 2011-06-26 MED ORDER — MISOPROSTOL 25 MCG QUARTER TABLET
25.0000 ug | ORAL_TABLET | ORAL | Status: DC | PRN
  Filled 2011-06-26: qty 1

## 2011-06-26 MED ORDER — ZOLPIDEM TARTRATE 10 MG PO TABS: 10.0000 mg | ORAL_TABLET | Freq: Every evening | ORAL | Status: DC | PRN

## 2011-06-26 MED ORDER — LACTATED RINGERS IV SOLN: 500.0000 mL | Freq: Once | INTRAVENOUS | Status: DC

## 2011-06-26 MED ORDER — LIDOCAINE HCL (PF) 1 % IJ SOLN
30.0000 mL | INTRAMUSCULAR | Status: DC | PRN
  Filled 2011-06-26 (×2): qty 30

## 2011-06-26 MED ORDER — EPHEDRINE 5 MG/ML INJ
10.0000 mg | INTRAVENOUS | Status: DC | PRN
  Filled 2011-06-26 (×2): qty 4

## 2011-06-26 MED ORDER — PHENYLEPHRINE 40 MCG/ML (10ML) SYRINGE FOR IV PUSH (FOR BLOOD PRESSURE SUPPORT)
80.0000 ug | PREFILLED_SYRINGE | INTRAVENOUS | Status: DC | PRN
  Filled 2011-06-26 (×2): qty 5

## 2011-06-26 MED ORDER — PHENYLEPHRINE 40 MCG/ML (10ML) SYRINGE FOR IV PUSH (FOR BLOOD PRESSURE SUPPORT)
80.0000 ug | PREFILLED_SYRINGE | INTRAVENOUS | Status: DC | PRN
  Filled 2011-06-26: qty 5

## 2011-06-26 MED ORDER — OXYCODONE-ACETAMINOPHEN 5-325 MG PO TABS: 2.0000 | ORAL_TABLET | ORAL | Status: DC | PRN

## 2011-06-26 NOTE — Anesthesia Preprocedure Evaluation (Signed)
Anesthesia Evaluation  Name, MR# and DOB Patient awake  General Assessment Comment  Reviewed: Allergy & Precautions, H&P , Patient's Chart, lab work & pertinent test results  History of Anesthesia Complications (+) PONV  Airway Mallampati: III TM Distance: >3 FB Neck ROM: full    Dental No notable dental hx. (+) Teeth Intact   Pulmonary  clear to auscultation  pulmonary exam normalPulmonary Exam Normal breath sounds clear to auscultation none    Cardiovascular regular Normal    Neuro/Psych Negative Neurological ROS  Negative Psych ROS  GI/Hepatic/Renal negative GI ROS  negative Liver ROS  negative Renal ROS   Medicated and Poorly Controlled     Endo/Other  Negative Endocrine ROS (+)   Morbid obesity  Abdominal   Musculoskeletal   Hematology negative hematology ROS (+)   Peds  Reproductive/Obstetrics (+) Pregnancy    Anesthesia Other Findings             Anesthesia Physical Anesthesia Plan  ASA: III  Anesthesia Plan: Epidural   Post-op Pain Management:    Induction:   Airway Management Planned:   Additional Equipment:   Intra-op Plan:   Post-operative Plan:   Informed Consent: I have reviewed the patients History and Physical, chart, labs and discussed the procedure including the risks, benefits and alternatives for the proposed anesthesia with the patient or authorized representative who has indicated his/her understanding and acceptance.     Plan Discussed with: Anesthesiologist  Anesthesia Plan Comments:         Anesthesia Quick Evaluation

## 2011-06-26 NOTE — H&P (Addendum)
Paula Horne is a 41 y.o. female presenting for induction of labor due to preeclampsia.  Called in to office today for abnormal 24 hour urine protein results.  BP in office 154/84.  Maternal Medical History:  Reason for admission: 40 y/o [id number] at 108 2/7 weeks with mild preeclampsia by 24 hour urine collection admitted for induction of labor.  Pt without complaints.  Elevated BP one week ago.  Pt with headaches that resolved spontaneously, she thought they were due to weather changes.  No visual changes, RUQ pain.  Active fetus, no contractions, no LOF, no VB.  Contractions: None.  Fetal activity: Perceived fetal activity is normal.    Prenatal complications: Hypertension and pre-eclampsia.   No IUGR.   Prenatal Complications - Diabetes: none.    OB History    Grav Para Term Preterm Abortions TAB SAB Ect Mult Living   6 1 1  4  4   1      Past Medical History  Diagnosis Date  . Migraine   . GERD (gastroesophageal reflux disease)   . Sinusitis   . PONV (postoperative nausea and vomiting)    Past Surgical History  Procedure Date  . Cholecystectomy 2001  . Esophageal dilation 04/2007    Dr.Kaplan   Family History: family history includes Alcohol abuse in her brother, maternal grandfather, and maternal grandmother; Colon cancer in her maternal uncle; Coronary artery disease in her paternal grandmother; Depression in her father and mother; and Skin cancer in her father. Social History:  reports that she has never smoked. She does not have any smokeless tobacco history on file. She reports that she does not drink alcohol or use illicit drugs.  Review of Systems  Constitutional: Negative for fever and chills.  HENT:       SEE HPI.  Eyes: Negative for blurred vision and double vision.  Respiratory: Negative for shortness of breath.   Cardiovascular: Negative for chest pain.  Gastrointestinal: Negative for abdominal pain.  Musculoskeletal: Negative.   Skin: Negative for rash.    Neurological: Positive for headaches.    Dilation: 1 Effacement (%): 50 Station: -3 Exam by:: Dr. Dion Body unknown if currently breastfeeding. Maternal Exam:  Uterine Assessment: None.  Abdomen: Estimated fetal weight is 6.5 lbs.   Fetal presentation: vertex  Introitus: Normal vulva. Ferning test: not done.  Nitrazine test: not done. Amniotic fluid character: not assessed.  Pelvis: adequate for delivery.   Cervix: Cervix evaluated by digital exam.     Fetal Exam Fetal Monitor Review: Mode: hand-held doppler probe.   Baseline rate: 150s.      Physical Exam  Constitutional: She is oriented to person, place, and time. She appears well-developed.  HENT:  Head: Normocephalic and atraumatic.  Eyes: EOM are normal.  Neck: Normal range of motion.  Cardiovascular: Normal rate, regular rhythm and normal heart sounds.   Respiratory: Breath sounds normal.  GI: There is no tenderness.       No RUQ tenderness or fundal tenderness.  Genitourinary: Uterus normal.  Musculoskeletal: Normal range of motion. She exhibits edema.       3+pitting edema  Neurological: She is alert and oriented to person, place, and time. She has normal reflexes.       DTRs 2+  Skin: Skin is warm and dry.    Prenatal labs: ABO, Rh:   Antibody:   Rubella:   RPR: NON REACTIVE (09/14 1115)  HBsAg:    HIV:    GBS:   Neg  24  hr urine protein- 369 protein on 06/22/11 Preeclampsia labs Hg 7.4, plts nl on 06/22/11 Urine dip 4+ in office today.  [place] at 35 week- vtx, afi nl, 5#9 oz. Assessment/Plan: Preeclampsia - IOL with Cytotec. Preeclampsia labs on admission.  Repeat urine dip. Pitocin in am. Severe Anemia - Await CBC at hospital. Treatment plan d/w pt, all questions answered. Above checked out to Dr. Neva Seat, OB on call overnight.    Paula Horne 06/26/2011, 6:21 PM

## 2011-06-26 NOTE — Anesthesia Procedure Notes (Signed)
Epidural Patient location during procedure: OB Start time: 06/26/2011 10:45 PM  Staffing Anesthesiologist: Amauria Younts A. Performed by: anesthesiologist   Preanesthetic Checklist Completed: patient identified, site marked, surgical consent, pre-op evaluation, timeout performed, IV checked, risks and benefits discussed and monitors and equipment checked  Epidural Patient position: sitting Prep: site prepped and draped and DuraPrep Patient monitoring: continuous pulse ox and blood pressure Approach: midline Injection technique: LOR air  Needle:  Needle type: Tuohy  Needle gauge: 17 G Needle length: 9 cm Needle insertion depth: 8 cm Catheter type: closed end flexible Catheter size: 19 Gauge Catheter at skin depth: 13 cm Test dose: negative and 1.5% lidocaine  Assessment Events: blood not aspirated, injection not painful, no injection resistance, negative IV test and no paresthesia  Additional Notes Patient is more comfortable after epidural dosed. Please see RN's note for documentation of vital signs and FHR which are stable.

## 2011-06-27 ENCOUNTER — Encounter (HOSPITAL_COMMUNITY): Payer: Self-pay | Admitting: *Deleted

## 2011-06-27 DIAGNOSIS — O09529 Supervision of elderly multigravida, unspecified trimester: Secondary | ICD-10-CM

## 2011-06-27 DIAGNOSIS — IMO0002 Reserved for concepts with insufficient information to code with codable children: Secondary | ICD-10-CM

## 2011-06-27 DIAGNOSIS — D62 Acute posthemorrhagic anemia: Secondary | ICD-10-CM

## 2011-06-27 LAB — CBC
Platelets: 218 10*3/uL (ref 150–400)
RBC: 3.15 MIL/uL — ABNORMAL LOW (ref 3.87–5.11)
RDW: 15.8 % — ABNORMAL HIGH (ref 11.5–15.5)
WBC: 11.1 10*3/uL — ABNORMAL HIGH (ref 4.0–10.5)

## 2011-06-27 LAB — PREPARE RBC (CROSSMATCH)

## 2011-06-27 MED ORDER — WITCH HAZEL-GLYCERIN EX PADS: 1.0000 "application " | MEDICATED_PAD | CUTANEOUS | Status: DC | PRN

## 2011-06-27 MED ORDER — DIBUCAINE 1 % RE OINT: 1.0000 "application " | TOPICAL_OINTMENT | RECTAL | Status: DC | PRN

## 2011-06-27 MED ORDER — PANTOPRAZOLE SODIUM 40 MG PO TBEC
40.0000 mg | DELAYED_RELEASE_TABLET | Freq: Every day | ORAL | Status: DC
  Filled 2011-06-27 (×3): qty 1

## 2011-06-27 MED ORDER — SENNOSIDES-DOCUSATE SODIUM 8.6-50 MG PO TABS
2.0000 | ORAL_TABLET | Freq: Every day | ORAL | Status: DC
  Administered 2011-06-27: 2 via ORAL

## 2011-06-27 MED ORDER — DIPHENHYDRAMINE HCL 25 MG PO CAPS: 25.0000 mg | ORAL_CAPSULE | Freq: Four times a day (QID) | ORAL | Status: DC | PRN

## 2011-06-27 MED ORDER — PRENATAL PLUS 27-1 MG PO TABS
1.0000 | ORAL_TABLET | Freq: Every day | ORAL | Status: DC
  Administered 2011-06-27 – 2011-06-28 (×2): 1 via ORAL
  Filled 2011-06-27 (×2): qty 1

## 2011-06-27 MED ORDER — ACETAMINOPHEN 325 MG PO TABS
650.0000 mg | ORAL_TABLET | Freq: Once | ORAL | Status: AC
  Administered 2011-06-27: 650 mg via ORAL
  Filled 2011-06-27: qty 2

## 2011-06-27 MED ORDER — LANOLIN HYDROUS EX OINT: TOPICAL_OINTMENT | CUTANEOUS | Status: DC | PRN

## 2011-06-27 MED ORDER — DIPHENHYDRAMINE HCL 25 MG PO CAPS
25.0000 mg | ORAL_CAPSULE | Freq: Once | ORAL | Status: AC
  Administered 2011-06-27: 25 mg via ORAL
  Filled 2011-06-27: qty 1

## 2011-06-27 MED ORDER — IBUPROFEN 600 MG PO TABS
600.0000 mg | ORAL_TABLET | Freq: Four times a day (QID) | ORAL | Status: DC
  Administered 2011-06-27 – 2011-06-28 (×6): 600 mg via ORAL
  Filled 2011-06-27: qty 1

## 2011-06-27 MED ORDER — ONDANSETRON HCL 4 MG PO TABS: 4.0000 mg | ORAL_TABLET | ORAL | Status: DC | PRN

## 2011-06-27 MED ORDER — BENZOCAINE-MENTHOL 20-0.5 % EX AERO: 1.0000 "application " | INHALATION_SPRAY | CUTANEOUS | Status: DC | PRN

## 2011-06-27 MED ORDER — SIMETHICONE 80 MG PO CHEW: 80.0000 mg | CHEWABLE_TABLET | ORAL | Status: DC | PRN

## 2011-06-27 NOTE — Progress Notes (Addendum)
Nursing note:  Patient's Hbg 7.5 prior to delivery, complains of SOB when ambulating with good pulse ox of 97% and HR of 92. Bleeding since delivery on mother/baby unit has been small and passed one medium sized clot when voiding at 0700 this morning 06/27/11. Patient's fundus is firm at umbilicus this a.m. No trickle with massage noted. Moderate pedal and lower extremity edema noted with slightly elevated BPs. Dr. Dion Body notified this a.m. Will round on patient this a.m. New orders received.

## 2011-06-27 NOTE — Plan of Care (Signed)
Problem: Phase I Progression Outcomes Goal: Count of instrument items (Specify in a note) Outcome: Completed/Met Date Met:  06/27/11 10 instruments

## 2011-06-27 NOTE — Progress Notes (Signed)
Post Partum Day 1  Subjective: Complaint of difficulty hearing. "Feels like my ears are stuffed with cotton."  SOB and chest pressure when getting up going to the bathroom.  "Felt like an elephant was on my chest." Denies headaches, visual changes.  Consents to blood products if needed.  Objective: Blood pressure 134/85, pulse 92, temperature 98 F (36.7 C), temperature source Oral, resp. rate 18, height 5\' 4"  (1.626 m), weight 111.585 kg (246 lb), SpO2 97.00%, unknown if currently breastfeeding.  Physical Exam:  General: alert and cooperative  Pale but unchanged from yesterday. Lochia: appropriate Uterine Fundus: firm Incision: N/a DVT Evaluation: Calf/Ankle edema is present. 2+ Neuro-2-3+ DTR   Basename 06/27/11 0805 06/26/11 1845  HGB 6.5* 7.5*  HCT 22.2* 26.5*    Assessment/Plan: Breastfeeding, Lactation consult and Circumcision prior to discharge Preeclampsia- Monitor BPs q 4 hours. Severe Anemia- Transfuse 2 units PRBCs.  Pt counseled on R/B/A of transfusion. Verbalized understanding.   LOS: 1 day   Paula Horne 06/27/2011, 1:22 PM   Late entry.  Pt seen at 0830.

## 2011-06-28 LAB — TYPE AND SCREEN
ABO/RH(D): A POS
Antibody Screen: NEGATIVE
Unit division: 0
Unit division: 0

## 2011-06-28 LAB — CBC
HCT: 29.9 % — ABNORMAL LOW (ref 36.0–46.0)
MCH: 22.4 pg — ABNORMAL LOW (ref 26.0–34.0)
MCV: 74.4 fL — ABNORMAL LOW (ref 78.0–100.0)
RDW: 17.8 % — ABNORMAL HIGH (ref 11.5–15.5)
WBC: 9.5 10*3/uL (ref 4.0–10.5)

## 2011-06-28 MED ORDER — SERTRALINE HCL 50 MG PO TABS: 50.0000 mg | ORAL_TABLET | Freq: Every day | ORAL | Status: DC

## 2011-06-28 MED ORDER — TETANUS-DIPHTH-ACELL PERTUSSIS 5-2.5-18.5 LF-MCG/0.5 IM SUSP
0.5000 mL | Freq: Once | INTRAMUSCULAR | Status: AC
  Administered 2011-06-28: 0.5 mL via INTRAMUSCULAR
  Filled 2011-06-28: qty 0.5

## 2011-06-28 MED ORDER — INFLUENZA VIRUS VACC SPLIT PF IM SUSP
0.5000 mL | Freq: Once | INTRAMUSCULAR | Status: AC
  Administered 2011-06-28: 0.5 mL via INTRAMUSCULAR
  Filled 2011-06-28: qty 0.5

## 2011-06-28 MED ORDER — IBUPROFEN 600 MG PO TABS: 600.0000 mg | ORAL_TABLET | Freq: Four times a day (QID) | ORAL | Status: AC | PRN

## 2011-06-28 MED ORDER — OXYCODONE-ACETAMINOPHEN 5-325 MG PO TABS: 2.0000 | ORAL_TABLET | ORAL | Status: AC | PRN

## 2011-06-28 MED ORDER — BENZONATATE 100 MG PO CAPS: 100.0000 mg | ORAL_CAPSULE | Freq: Three times a day (TID) | ORAL | Status: DC | PRN

## 2011-06-28 NOTE — Discharge Summary (Signed)
Obstetric Discharge Summary Reason for Admission: induction of labor and Preeclampsia @ 37 2/7 weeks Prenatal Procedures: none Intrapartum Procedures: spontaneous vaginal delivery Postpartum Procedures: transfusion 2 units PRBCs Complications-Operative and Postpartum: 2 degree perineal laceration Hemoglobin  Date Value Range Status  06/28/2011 9.0* 12.0-15.0 (g/dL) Final     DELTA CHECK NOTED     REPEATED TO VERIFY     POST TRANSFUSION SPECIMEN     HCT  Date Value Range Status  06/28/2011 29.9* 36.0-46.0 (%) Final    Discharge Diagnoses: Term Pregnancy-delivered, Severe Anemia s/p Blood transfusion, Preeclampsia  Discharge Information: Date: 06/28/2011 Activity: pelvic rest Diet: routine Medications: PNV, Ibuprophen, Iron, Percocet and Zoloft if pt desires to proceed with treatment Condition: improved Instructions: refer to practice specific booklet Discharge to: home   Newborn Data: Live born female  Birth Weight: 5 lb 10.8 oz (2575 g) APGAR: 8, 9  Home with Rooming in.  Watching baby's weight.Marland Kitchen  Paula Horne 06/28/2011, 2:04 PM

## 2011-06-28 NOTE — Progress Notes (Signed)
Delivery Note At 12:21 AM a viable female was delivered via Vaginal, Spontaneous Delivery (Presentation: Right Occiput Anterior). By Nurse midwife  APGAR: 8, 9; weight 5 lb 10.8 oz (2575 g).   Placenta status: , Spontaneous.  Cord: 3 vessels with the following complications: None.  Cord pH: no Post partum exam by me with removal of vaginal sponge.  Anesthesia: Epidural  Episiotomy: None Lacerations: 2nd degree;Perineal Suture Repair: chromic Est. Blood Loss (mL):   Mom to postpartum.  Baby to nursery-stable.  Bailea Beed E 06/28/2011, 6:03 AM

## 2011-06-28 NOTE — Anesthesia Postprocedure Evaluation (Signed)
  Anesthesia Post-op Note  Patient: Paula Horne  Procedure(s) Performed: * Lumbar Epidural for L & D *  Patient Location: Mother/Baby  Anesthesia Type: Epidural  Level of Consciousness: awake, alert  and oriented  Airway and Oxygen Therapy: Patient Spontanous Breathing  Post-op Pain: none  Post-op Assessment: Post-op Vital signs reviewed, Patient's Cardiovascular Status Stable, Respiratory Function Stable, Patent Airway, No signs of Nausea or vomiting, Pain level controlled, No headache, No backache, No residual numbness and No residual motor weakness  Post-op Vital Signs: Reviewed and stable  Complications: No apparent anesthesia complications

## 2011-06-28 NOTE — Progress Notes (Signed)
Post Partum Day 2 Subjective: GI upset.  Bleeding light.  Feels better s/p transfusion.  Hearing as returned to normal.  Baby not being discharged today.  Peds wants to watch his weight.  Pt reports she had a h/o depression prior to having children that was controlled with Cymbalta.  She had postpartum depression and required medication for 4 months.  She does not remember medication but it was not Cymbalta due to breast feeding.  Objective: Blood pressure 119/76, pulse 78, temperature 98 F (36.7 C), temperature source Oral, resp. rate 20, height 5\' 4"  (1.626 m), weight 111.585 kg (246 lb), SpO2 97.00%, unknown if currently breastfeeding.  Physical Exam:  General: alert and cooperative Lochia: appropriate Uterine Fundus: firm Incision: N/a DVT Evaluation: Calf/Ankle edema is present.2+   Basename 06/28/11 0540 06/27/11 0805  HGB 9.0* 6.5*  HCT 29.9* 22.2*  s/p 2 units PRBCs (06/27/11)  Assessment/Plan: Discharge home and Breastfeeding Circ done by Faculty practice service. Severe Anemia- Improved s/p Transfusion.  Cont Iron. Preeclampsia-  BPs nl.  BP check in 1 week. H/o Postpartum depression and h/o Depression prior to having children.  Pt unsure if she wants to start meds.  Recommended she treat depression propyalactically.  Will f/u 2 week pp.  Give Zoloft rx in the event the she changes her mind.   LOS: 2 days   Janazia Schreier 06/28/2011, 1:45 PM

## 2011-06-29 ENCOUNTER — Encounter (HOSPITAL_COMMUNITY)
Admission: RE | Admit: 2011-06-29 | Discharge: 2011-06-29 | Disposition: A | Discharge status: Home / Self Care | Payer: BC Managed Care – PPO | Source: Ambulatory Visit | Attending: Obstetrics and Gynecology | Admitting: Obstetrics and Gynecology

## 2011-06-29 DIAGNOSIS — O923 Agalactia: Secondary | ICD-10-CM | POA: Insufficient documentation

## 2011-07-11 LAB — DIFFERENTIAL
Basophils Absolute: 0.1
Eosinophils Relative: 0
Lymphocytes Relative: 12
Lymphs Abs: 1.2
Monocytes Absolute: 0.4
Monocytes Relative: 4
Neutro Abs: 8.1 — ABNORMAL HIGH

## 2011-07-11 LAB — URINE MICROSCOPIC-ADD ON

## 2011-07-11 LAB — COMPREHENSIVE METABOLIC PANEL
AST: 129 — ABNORMAL HIGH
Albumin: 3.5
BUN: 7
Calcium: 8.6
Chloride: 107
Creatinine, Ser: 0.53
GFR calc Af Amer: >60
Total Protein: 6.3

## 2011-07-11 LAB — CBC
HCT: 37.3
MCV: 83.2
Platelets: 295
RDW: 13.5
WBC: 9.8

## 2011-07-11 LAB — URINALYSIS, ROUTINE W REFLEX MICROSCOPIC
Glucose, UA: NEGATIVE
Ketones, ur: NEGATIVE
Leukocytes, UA: NEGATIVE
Nitrite: NEGATIVE
Protein, ur: NEGATIVE
pH: 6

## 2011-07-11 LAB — PREGNANCY, URINE: Preg Test, Ur: NEGATIVE

## 2011-08-04 ENCOUNTER — Other Ambulatory Visit: Payer: Self-pay | Admitting: Internal Medicine

## 2011-08-04 NOTE — Telephone Encounter (Signed)
RX sent in on 05/19/11 #30/5 refills, patient not due for additional refills until -11/2011. Left message for pharmacy, fill med if not received 05/19/11

## 2011-10-05 ENCOUNTER — Other Ambulatory Visit: Payer: Self-pay | Admitting: Internal Medicine

## 2011-10-06 NOTE — Telephone Encounter (Signed)
Refill X 1

## 2011-10-06 NOTE — Telephone Encounter (Signed)
Last seen for migraines on 11/03/10.  Follow up appt on 10/12/11.  Is this OK to refill?  Please advise.

## 2011-10-09 ENCOUNTER — Encounter: Payer: Self-pay | Admitting: Internal Medicine

## 2011-10-12 ENCOUNTER — Ambulatory Visit (INDEPENDENT_AMBULATORY_CARE_PROVIDER_SITE_OTHER): Payer: BC Managed Care – PPO | Admitting: Internal Medicine

## 2011-10-12 ENCOUNTER — Encounter: Payer: Self-pay | Admitting: Internal Medicine

## 2011-10-12 DIAGNOSIS — F329 Major depressive disorder, single episode, unspecified: Secondary | ICD-10-CM

## 2011-10-12 DIAGNOSIS — G43909 Migraine, unspecified, not intractable, without status migrainosus: Secondary | ICD-10-CM

## 2011-10-12 MED ORDER — DULOXETINE HCL 60 MG PO CPEP: 60.0000 mg | ORAL_CAPSULE | Freq: Every day | ORAL | Status: DC

## 2011-10-12 NOTE — Progress Notes (Signed)
  Subjective:    Patient ID: Paula Horne, female    DOB: 09-May-1971, 40 y.o.   MRN: [id number]  HPI Mood Issues: Depression:controlled by Cymbalta which was  started in late Oct 12 after completing breast feeding. She was off all meds during both pregnancies.Sertraline was Rxed post partum depression. No further pregnancies planned . 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 Relpax helps.No  numbness and tingling, weakness Endocrinologic signs and symptoms: No hoarseness, weight change, vision change, temperature intolerance, bowel changes, skin/hair,/nail changes TFTs normal in 10/12     Review of Systems     Objective:   Physical Exam  Gen.:  well-nourished; in no acute distress Eyes: Extraocular motion intact; no lid lag or proptosis Heart: Normal rhythm and rate without significant murmur, gallop, or extra heart sounds. Accentuated 2nd heart sound Lungs: Chest clear to auscultation without rales,rales, wheezes Neuro:Deep tendon reflexes are equal and within normal limits; no tremor  Skin: Warm and dry without significant lesions or rashes; no onycholysis Psych: Normally communicative and interactive; no abnormal mood or affect clinically.         Assessment & Plan:    #1 depression, controlled with Cymbalta which will be continued. #2 migraines ; monitor indicated. Prodrome & aura discussed

## 2011-10-12 NOTE — Patient Instructions (Addendum)
Please keep a diary of your headaches . Document  each occurrence on the calendar with notation of : #1 any prodrome ( any non headache symptom such as marked fatigue,visual changes, ,etc ) which precedes actual headache ; #2) severity on 1-10 scale; #3) any triggers ( food/ drink,enviromenntal or weather changes ,physical or emotional stress) in 8-12 hour period prior to the headache; & #4) response to any medications or other intervention. Please review "Headache" @ [facility] for additional information.    Take Cymbalta 30 mg in place of 60 mg  if Relpax taken for migraines

## 2011-11-17 ENCOUNTER — Telehealth: Payer: Self-pay | Admitting: Internal Medicine

## 2011-11-17 MED ORDER — ELETRIPTAN HYDROBROMIDE 40 MG PO TABS: ORAL_TABLET | ORAL | Status: DC

## 2011-11-17 NOTE — Telephone Encounter (Signed)
Refill- relpax 40mg  tab pfiz. Take one tablet by mouth daily as needed for migraines. Qty 8 last fill 12.21.12

## 2011-11-17 NOTE — Telephone Encounter (Signed)
RX sent

## 2012-01-03 ENCOUNTER — Ambulatory Visit (INDEPENDENT_AMBULATORY_CARE_PROVIDER_SITE_OTHER): Payer: BC Managed Care – PPO | Admitting: Internal Medicine

## 2012-01-03 VITALS — BP 128/62 | HR 89 | Temp 98.2°F | Ht 65.0 in | Wt 223.0 lb

## 2012-01-03 DIAGNOSIS — M549 Dorsalgia, unspecified: Secondary | ICD-10-CM

## 2012-01-03 DIAGNOSIS — H9209 Otalgia, unspecified ear: Secondary | ICD-10-CM

## 2012-01-03 DIAGNOSIS — R55 Syncope and collapse: Secondary | ICD-10-CM

## 2012-01-03 DIAGNOSIS — R51 Headache: Secondary | ICD-10-CM

## 2012-01-03 DIAGNOSIS — J069 Acute upper respiratory infection, unspecified: Secondary | ICD-10-CM

## 2012-01-03 DIAGNOSIS — R112 Nausea with vomiting, unspecified: Secondary | ICD-10-CM

## 2012-01-03 LAB — CBC WITH DIFFERENTIAL/PLATELET
Basophils Relative: 0.2 % (ref 0.0–3.0)
Eosinophils Absolute: 0.1 10*3/uL (ref 0.0–0.7)
HCT: 36.8 % (ref 36.0–46.0)
Hemoglobin: 11.8 g/dL — ABNORMAL LOW (ref 12.0–15.0)
Lymphocytes Relative: 17.4 % (ref 12.0–46.0)
Lymphs Abs: 1.6 10*3/uL (ref 0.7–4.0)
MCHC: 32.1 g/dL (ref 30.0–36.0)
MCV: 77.7 fl — ABNORMAL LOW (ref 78.0–100.0)
Neutro Abs: 7.3 10*3/uL (ref 1.4–7.7)
RBC: 4.74 Mil/uL (ref 3.87–5.11)
RDW: 16.3 % — ABNORMAL HIGH (ref 11.5–14.6)

## 2012-01-03 LAB — BASIC METABOLIC PANEL
BUN: 9 mg/dL (ref 6–23)
Calcium: 8.6 mg/dL (ref 8.4–10.5)
Chloride: 99 mEq/L (ref 96–112)
Creatinine, Ser: 0.7 mg/dL (ref 0.4–1.2)

## 2012-01-03 LAB — POCT URINALYSIS DIPSTICK
Glucose, UA: NEGATIVE
Leukocytes, UA: NEGATIVE
Protein, UA: NEGATIVE
Urobilinogen, UA: 0.2

## 2012-01-03 LAB — HEPATIC FUNCTION PANEL
ALT: 11 U/L (ref 0–35)
Total Bilirubin: 0.1 mg/dL — ABNORMAL LOW (ref 0.3–1.2)
Total Protein: 7.6 g/dL (ref 6.0–8.3)

## 2012-01-03 MED ORDER — DOXYCYCLINE HYCLATE 100 MG PO TABS: 100.0000 mg | ORAL_TABLET | Freq: Two times a day (BID) | ORAL | Status: AC

## 2012-01-03 NOTE — Patient Instructions (Signed)
Stay on clear liquids for 48-72 hours or until back to normal.This would include  jello, sherbert (NOT ice cream), Li[employer] chicken noodle soup(NOT cream based soups),Gatorade Lite, flat Ginger ale (without High Fructose Corn Syrup),dry toast or crackers, baked potato.No milk , dairy or grease until well. Align , a [employer] , daily if stools are loose.  Report increasing pain, fever or rectal bleeding

## 2012-01-03 NOTE — Progress Notes (Signed)
Subjective:    Patient ID: Paula Horne, female    DOB: 03/08/1971, 41 y.o.   MRN: [id number]  HPI Respiratory tract infection Onset/symptoms:1 month as sinus pressure Exposures (illness/environmental/extrinsic):? Student ill Progression of symptoms:to "cold" followed by frontal & occipital headache Treatments/response:OTC meds w/o benefit Present symptoms: Fever/chills/sweats:sweats X 2 days, N&V with syncope 3/19 Frontal headache:no @ bridge of nose Facial pain:no Nasal purulence:no Sore throat:minor Dental pain:no Earache : yes w/o discharge Lymphadenopathy:no Wheezing/shortness of breath:no Cough/sputum/hemoptysis:dry Associated extrinsic/allergic symptoms:itchy eyes/ sneezing:no Past medical history: Seasonal allergies:no/asthma:no Smoking history:never  Her daughter's been diagnosed as having a sinus infection at the pediatrician today           Review of Systems She had 3-4 episodes of nausea and vomiting 3/18  followed by near syncope. She was sent home from school. She felt better and returned to school 3/19. Approximately 12:30 she stands syncope; upon awakening she had nausea and vomiting again. She had no seizure stigmata or musculoskeletal injury with the brief loss of consciousness.  She denies abdominal pain, diarrhea, melena , or rectal bleeding.  She's had some lower back pain for which she used a heating pad. This caused some skin blisters.  She denies hematuria, pyuria, or dysuria.     Objective:   Physical Exam General appearance:good health ;well nourished; no acute distress or increased work of breathing is present.  No  lymphadenopathy about the head, neck, or axilla noted.   Eyes: No conjunctival inflammation or lid edema is present. There is no scleral icterus. EOMI w/o nystagmus  Ears:  External ear exam shows no significant lesions or deformities.  Otoscopic examination reveals clear canals, tympanic membranes are intact bilaterally  without bulging, retraction, inflammation or discharge.  Nose:  External nasal examination shows no deformity or inflammation. Nasal mucosa are dry without lesions or exudates. No septal dislocation or deviation.No obstruction to airflow.   Oral exam: Dental hygiene is good; lips and gums are healthy appearing.There is no oropharyngeal erythema or exudate noted.   Neck:  No deformities,  masses, or tenderness noted.   Supple with full range of motion without pain.   Heart:  Normal rate and regular rhythm. S1 and S2 normal without gallop,  click, rub or other extra sounds. Grade 1/6 systolic murmur   Lungs:Chest clear to auscultation; no wheezes, rhonchi,rales ,or rubs present.No increased work of breathing.  Bowel sounds are normal. Abdomen is soft and nontender with no organomegaly, hernias  or masses.   MS/Extremities:  No cyanosis, edema, or clubbing  noted . Negative SLR. She is able to lay down  and sit up without help Bowel sounds are decreased w/o ileus. Abdomen is soft and nontender with no organomegaly, hernias  or masses.    Skin: Warm & dry w/o jaundice or tenting. She has scattered excoriations over the back with mild erythema around the lesions. There are no blisters present at this time.  Psych: Arsenio Katz Indifference in reference to syncope ("I used to pass out frequently as a child")          Assessment & Plan:  #1 upper respiratory tract symptoms with ear pressure and discomfort of the bridge of the nose. No definite criteria for rhinosinusitis diagnosis  #2 nausea and vomiting  #3 syncope in the context of #2  #4 low back pain probably from acute illness; no genitourinary symptoms  #5 skin blistering from heating pad; minimal cellulitis at the site of excoriations  Plan: See orders and recommendations

## 2012-01-15 ENCOUNTER — Other Ambulatory Visit: Payer: Self-pay | Admitting: Obstetrics and Gynecology

## 2012-01-15 ENCOUNTER — Other Ambulatory Visit (HOSPITAL_COMMUNITY)
Admission: RE | Admit: 2012-01-15 | Discharge: 2012-01-15 | Disposition: A | Discharge status: Home / Self Care | Payer: BC Managed Care – PPO | Source: Ambulatory Visit | Attending: Obstetrics and Gynecology | Admitting: Obstetrics and Gynecology

## 2012-01-15 DIAGNOSIS — Z01419 Encounter for gynecological examination (general) (routine) without abnormal findings: Secondary | ICD-10-CM | POA: Insufficient documentation

## 2012-01-17 ENCOUNTER — Other Ambulatory Visit (INDEPENDENT_AMBULATORY_CARE_PROVIDER_SITE_OTHER): Payer: BC Managed Care – PPO

## 2012-01-17 DIAGNOSIS — R5383 Other fatigue: Secondary | ICD-10-CM

## 2012-01-17 DIAGNOSIS — R5381 Other malaise: Secondary | ICD-10-CM

## 2012-01-17 LAB — POTASSIUM: Potassium: 3.5 mEq/L (ref 3.5–5.1)

## 2012-01-17 LAB — FERRITIN: Ferritin: 7.7 ng/mL — ABNORMAL LOW (ref 10.0–291.0)

## 2012-01-23 ENCOUNTER — Other Ambulatory Visit: Payer: Self-pay | Admitting: Internal Medicine

## 2012-01-23 NOTE — Telephone Encounter (Signed)
Refill done.  

## 2012-02-02 ENCOUNTER — Other Ambulatory Visit: Payer: Self-pay | Admitting: Internal Medicine

## 2012-04-30 ENCOUNTER — Other Ambulatory Visit: Payer: Self-pay | Admitting: Internal Medicine

## 2012-05-01 ENCOUNTER — Ambulatory Visit (HOSPITAL_COMMUNITY)
Admission: RE | Admit: 2012-05-01 | Discharge: 2012-05-01 | Disposition: A | Discharge status: Home / Self Care | Payer: BC Managed Care – PPO | Source: Ambulatory Visit | Attending: Family Medicine | Admitting: Family Medicine

## 2012-05-01 ENCOUNTER — Ambulatory Visit: Payer: BC Managed Care – PPO

## 2012-05-01 ENCOUNTER — Telehealth: Payer: Self-pay | Admitting: Radiology

## 2012-05-01 ENCOUNTER — Ambulatory Visit (INDEPENDENT_AMBULATORY_CARE_PROVIDER_SITE_OTHER): Payer: BC Managed Care – PPO | Admitting: Family Medicine

## 2012-05-01 VITALS — BP 120/80 | HR 81 | Temp 98.0°F | Resp 16 | Ht 64.0 in | Wt 229.8 lb

## 2012-05-01 DIAGNOSIS — M25539 Pain in unspecified wrist: Secondary | ICD-10-CM

## 2012-05-01 DIAGNOSIS — S060X9A Concussion with loss of consciousness of unspecified duration, initial encounter: Secondary | ICD-10-CM | POA: Insufficient documentation

## 2012-05-01 DIAGNOSIS — R4020 Unspecified coma: Secondary | ICD-10-CM

## 2012-05-01 DIAGNOSIS — M79609 Pain in unspecified limb: Secondary | ICD-10-CM

## 2012-05-01 DIAGNOSIS — T148XXA Other injury of unspecified body region, initial encounter: Secondary | ICD-10-CM

## 2012-05-01 DIAGNOSIS — T1490XA Injury, unspecified, initial encounter: Secondary | ICD-10-CM | POA: Insufficient documentation

## 2012-05-01 DIAGNOSIS — M79603 Pain in arm, unspecified: Secondary | ICD-10-CM

## 2012-05-01 DIAGNOSIS — R110 Nausea: Secondary | ICD-10-CM

## 2012-05-01 DIAGNOSIS — R404 Transient alteration of awareness: Secondary | ICD-10-CM

## 2012-05-01 DIAGNOSIS — R9389 Abnormal findings on diagnostic imaging of other specified body structures: Secondary | ICD-10-CM

## 2012-05-01 DIAGNOSIS — M25519 Pain in unspecified shoulder: Secondary | ICD-10-CM

## 2012-05-01 DIAGNOSIS — W19XXXA Unspecified fall, initial encounter: Secondary | ICD-10-CM | POA: Insufficient documentation

## 2012-05-01 MED ORDER — NAPROXEN 500 MG PO TABS: 500.0000 mg | ORAL_TABLET | Freq: Two times a day (BID) | ORAL | Status: DC

## 2012-05-01 NOTE — Telephone Encounter (Signed)
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 She is to take the medications, Naprosyn and Skelaxin and return to clinic if not better with medications.

## 2012-05-01 NOTE — Progress Notes (Signed)
[facility]:  Office Visit  Chief Complaint:  Chief Complaint  Patient presents with  . Fall    fell in driveway chasing 41 yr old hit back of head left shoulder left elbow entire left side yesterday 4:45 pm  . Loss of Consciousness    also felt nauseated    HPI: Paula Horne is a 41 y.o. female who complains of  Neck pain, back pain, shoulder pain, wrist pain associated with LOC after hitting head after falling in driveway after chasing her 37 mos old daughter. Unwitnessed fall. ? Amount of time with LOC.She fell on her left side, passed out for a ? few seconds yesterday. After the fall her head was sore but no HA, no vision changes/light sensitivity/nosie sensitivity. Denies any confusion. Denies being on any blood thinners.  Denies CP/SOB/palpitaions. + Minimal nausea.+ Head pain. Patient does not remember how she fell or mechanism of fall.  + Weakness in hand, can't pick up her children. + Shoulder pain and neck pain, decrease ROM.   Past Medical History  Diagnosis Date  . Migraine   . GERD (gastroesophageal reflux disease)   . Sinusitis   . PONV (postoperative nausea and vomiting)    Past Surgical History  Procedure Date  . Cholecystectomy 2001  . Esophageal dilation 04/2007    Dr.Kaplan   History   Social History  . Marital Status: Married    Spouse Name: N/A    Number of Children: N/A  . Years of Education: N/A   Social History Main Topics  . Smoking status: Never Smoker   . Smokeless tobacco: Not on file  . Alcohol Use: No  . Drug Use: No  . Sexually Active: Not on file   Other Topics Concern  . Not on file   Social History Narrative  . No narrative on file   Family History  Problem Relation Age of Onset  . Depression Father   . Skin cancer Father   . Colon cancer Maternal Uncle   . Depression Mother   . Alcohol abuse Brother   . Alcohol abuse Maternal Grandmother   . Stroke Maternal Grandmother   . Alcohol abuse Maternal  Grandfather   . Coronary artery disease Paternal Grandmother   . Cancer Other     ovarian   Allergies  Allergen Reactions  . Sulfonamide Derivatives     REACTION: diffuse rash; no Levonne Spiller Syndrome  phenomena   Prior to Admission medications   Medication Sig Start Date End Date Taking? Authorizing Provider  CYMBALTA 60 MG capsule TAKE ONE CAPSULE BY MOUTH ONE TIME DAILY 04/30/12  Yes Pecola Lawless, MD  metaxalone (SKELAXIN) 800 MG tablet Take 800 mg by mouth 3 (three) times daily.   Yes Historical Provider, MD  NEXIUM 40 MG capsule TAKE 1 CAPSULE BY MOUTH ONCE DAILY 02/02/12  Yes Pecola Lawless, MD  norethindrone-ethinyl estradiol (MICROGESTIN,JUNEL,LOESTRIN) 1-20 MG-MCG tablet Take 1 tablet by mouth daily.     Yes Historical Provider, MD  topiramate (TOPAMAX) 100 MG tablet Take 100 mg by mouth 2 (two) times daily.   Yes Historical Provider, MD  eletriptan (RELPAX) 40 MG tablet One tablet by mouth as needed for migraine headache.  If the headache improves and then returns, dose may be repeated after 2 hours have elapsed since first dose (do not exceed 80 mg per day). TAKE ONE TABLET BY MOUTH DAILY AS NEEDED FOR MIGRAINES. 11/17/11   Pecola Lawless, MD  ROS: The patient denies fevers, chills, night sweats, unintentional weight loss, chest pain, palpitations, wheezing, dyspnea on exertion, nausea, vomiting, abdominal pain, dysuria, hematuria, melena, numbness, weakness, or tingling.   All other systems have been reviewed and were otherwise negative with the exception of those mentioned in the HPI and as above.    PHYSICAL EXAM: Filed Vitals:   05/01/12 1411  BP: 120/80  Pulse: 81  Temp: 98 F (36.7 C)  Resp: 16   Filed Vitals:   05/01/12 1411  Height: 5\' 4"  (1.626 m)  Weight: 229 lb 12.8 oz (104.237 kg)   Body mass index is 39.45 kg/(m^2).  General: Alert, no acute distress HEENT:  Normocephalic, atraumatic, oropharynx patent. EOMI, PERRLA, fundoscopic exam  nl Cardiovascular:  Regular rate and rhythm, no rubs murmurs or gallops.  No Carotid bruits, radial pulse intact. No pedal edema.  Respiratory: Clear to auscultation bilaterally.  No wheezes, rales, or rhonchi.  No cyanosis, no use of accessory musculature GI: No organomegaly, abdomen is soft and non-tender, positive bowel sounds.  No masses. Skin: Abrasion left elbow, warm, erythematous Neurologic: Facial musculature symmetric. CN 2-12 grossly intact Psychiatric: Patient is appropriate throughout our interaction. Lymphatic: No cervical lymphadenopathy Musculoskeletal: Gait intact. Neck: Paraspinal msk tenderness bilaterally; Pain with extension and flexion of neck, neg Spurling  Left Shoulder: Decrease ROM Left shoulder in AROM, PROM nl, tedner on palpation of lateral shoulder Left elbow: tender left elbow and proximal forearm, + nl A/PROM, + abrasion, + swelling, + warmth at site of abrasion Left wrist: tender wrist at scaphoid, A/PROM intact at Left elbow, + radial a. ,+ sensation,4/5 strength wrist   LABS: Results for orders placed in visit on 01/17/12  FERRITIN      Component Value Range   Ferritin 7.7 (*) 10.0 - 291.0 ng/mL  POTASSIUM      Component Value Range   Potassium 3.5  3.5 - 5.1 mEq/L     EKG/XRAY:   Primary read interpreted by Dr. Conley Rolls at [facility]. C-spine, Shoulder, elbow, forearm, wrist --no appreciable acute fractures/subluxation   ASSESSMENT/PLAN: Encounter Diagnoses  Name Primary?  . Injury   . Shoulder pain   . Wrist pain   . Arm pain   . LOC (loss of consciousness) Yes  . Nausea alone   . Sprain and strain    Msk sprain/strain. No appreciable fx/dislocation on xray . Rx Naproxen and to take Skelaxin ( which she already has) after get CT results from me. Patient was given sling for comfort Elbow abrasion: clean with soap and water, monitor for s/sx of infection  Concussion vs bleed?-Patient fell yesterday with unwitnessed LOC, + nausea, head soreness. Today  on neuro exam she has unsteadiness with cerebellar testing, and unsteady on feet , was told to draw clock at 3 o'clock but drew clock at 2 o'clock.  Will get CT scan today without contrast   Lun Muro PHUONG, DO 05/01/2012 3:40 PM

## 2012-05-03 ENCOUNTER — Telehealth: Payer: Self-pay | Admitting: Family Medicine

## 2012-05-03 NOTE — Telephone Encounter (Signed)
LM tos ee how patient is doing. Also to relay again that CT head was negative for anything acute. If she has worsening sxs then need to f/u with [place] or go to ER.

## 2012-06-05 ENCOUNTER — Other Ambulatory Visit: Payer: Self-pay | Admitting: Internal Medicine

## 2012-06-05 MED ORDER — ESOMEPRAZOLE MAGNESIUM 40 MG PO CPDR: DELAYED_RELEASE_CAPSULE | ORAL | Status: DC

## 2012-06-05 NOTE — Telephone Encounter (Signed)
RX sent

## 2012-06-05 NOTE — Telephone Encounter (Signed)
NEXIUM DR 40 MG CAPSULE QTY: 30 TAKE 1 CAPSULE BY MOUTH ONCE A DAILY

## 2012-06-20 ENCOUNTER — Telehealth: Payer: Self-pay | Admitting: *Deleted

## 2012-06-20 NOTE — Telephone Encounter (Signed)
Prior Auth approved 06-20-12 until 06-20-13, approval letter scan to chart pharmacy faxed.

## 2012-07-05 ENCOUNTER — Emergency Department (HOSPITAL_COMMUNITY)
Admission: EM | Admit: 2012-07-05 | Discharge: 2012-07-05 | Disposition: A | Discharge status: Home / Self Care | Payer: BC Managed Care – PPO | Attending: Emergency Medicine | Admitting: Emergency Medicine

## 2012-07-05 ENCOUNTER — Encounter (HOSPITAL_COMMUNITY): Payer: Self-pay

## 2012-07-05 DIAGNOSIS — Z882 Allergy status to sulfonamides status: Secondary | ICD-10-CM | POA: Insufficient documentation

## 2012-07-05 DIAGNOSIS — K219 Gastro-esophageal reflux disease without esophagitis: Secondary | ICD-10-CM | POA: Insufficient documentation

## 2012-07-05 DIAGNOSIS — G43909 Migraine, unspecified, not intractable, without status migrainosus: Secondary | ICD-10-CM | POA: Insufficient documentation

## 2012-07-05 MED ORDER — ONDANSETRON HCL 4 MG PO TABS: 4.0000 mg | ORAL_TABLET | Freq: Four times a day (QID) | ORAL | Status: DC

## 2012-07-05 MED ORDER — SODIUM CHLORIDE 0.9 % IV BOLUS (SEPSIS)
1000.0000 mL | Freq: Once | INTRAVENOUS | Status: AC
  Administered 2012-07-05: 1000 mL via INTRAVENOUS

## 2012-07-05 MED ORDER — DEXAMETHASONE SODIUM PHOSPHATE 10 MG/ML IJ SOLN
10.0000 mg | Freq: Once | INTRAMUSCULAR | Status: AC
  Administered 2012-07-05: 10 mg via INTRAVENOUS
  Filled 2012-07-05: qty 1

## 2012-07-05 MED ORDER — HYDROCODONE-ACETAMINOPHEN 5-500 MG PO TABS: 1.0000 | ORAL_TABLET | Freq: Four times a day (QID) | ORAL | Status: DC | PRN

## 2012-07-05 MED ORDER — ONDANSETRON HCL 4 MG/2ML IJ SOLN
INTRAMUSCULAR | Status: AC
  Administered 2012-07-05: 02:00:00
  Filled 2012-07-05: qty 2

## 2012-07-05 MED ORDER — MORPHINE SULFATE 4 MG/ML IJ SOLN
4.0000 mg | Freq: Once | INTRAMUSCULAR | Status: AC
  Administered 2012-07-05: 4 mg via INTRAVENOUS
  Filled 2012-07-05: qty 1

## 2012-07-05 MED ORDER — KETOROLAC TROMETHAMINE 30 MG/ML IJ SOLN
30.0000 mg | Freq: Once | INTRAMUSCULAR | Status: AC
  Administered 2012-07-05: 30 mg via INTRAVENOUS
  Filled 2012-07-05: qty 1

## 2012-07-05 MED ORDER — METOCLOPRAMIDE HCL 5 MG/ML IJ SOLN
10.0000 mg | Freq: Once | INTRAMUSCULAR | Status: AC
  Administered 2012-07-05: 10 mg via INTRAVENOUS
  Filled 2012-07-05: qty 2

## 2012-07-05 NOTE — ED Notes (Signed)
Per EMS, pt from home c/o migraine. Hx of same but not recently.   N/V.  zofran 4mg  IV given in 20g IV in Rt AC.  Vitals:  122 palp, hr 86, resp 20.

## 2012-07-05 NOTE — ED Provider Notes (Signed)
Medical screening examination/treatment/procedure(s) were performed by non-physician practitioner and as supervising physician I was immediately available for consultation/collaboration.  Olivia Mackie, MD 07/05/12 [phone]

## 2012-07-05 NOTE — ED Notes (Signed)
[id number]<ON> Expected date:<BR> Expected time:<BR> Means of arrival:<BR> Comments:<BR> EMS/migraine

## 2012-07-05 NOTE — ED Provider Notes (Signed)
History     CSN: [id number]  Arrival date & time 07/05/12  0141   First MD Initiated Contact with Patient 07/05/12 0205      Chief Complaint  Patient presents with  . Migraine    (Consider location/radiation/quality/duration/timing/severity/associated sxs/prior treatment) HPI She presents to the emergency department by EMS with complaints of a migraine. She states that it started very lightly this morning but has been increasingly getting worse all throughout the day. She states that she was trying to sleep when her headache became unbearable and she called the ambulance to come to the emergency department she states it is migraine pain is like her normal migraine it's just the pain is more severe. She has not had a migraine this bad in about 10 years. The patient had a head CT done in July of 2013, only 2 months ago which was normal. The patient's blood pressure in triage is normal. She does not have any neurological symptoms of numbness, weakness, change in vision. She denies having any nausea or vomiting. Patient's only complaint at this time his pain which is located in the frontal region and is throbbing in nature. Her husband offers up the information that she's never been good with dealing with pain. NAD/VSS   Past Medical History  Diagnosis Date  . Migraine   . GERD (gastroesophageal reflux disease)   . Sinusitis   . PONV (postoperative nausea and vomiting)     Past Surgical History  Procedure Date  . Cholecystectomy 2001  . Esophageal dilation 04/2007    Dr.Kaplan    Family History  Problem Relation Age of Onset  . Depression Father   . Skin cancer Father   . Colon cancer Maternal Uncle   . Depression Mother   . Alcohol abuse Brother   . Alcohol abuse Maternal Grandmother   . Stroke Maternal Grandmother   . Alcohol abuse Maternal Grandfather   . Coronary artery disease Paternal Grandmother   . Cancer Other     ovarian    History  Substance Use Topics  .  Smoking status: Never Smoker   . Smokeless tobacco: Not on file  . Alcohol Use: No    OB History    Grav Para Term Preterm Abortions TAB SAB Ect Mult Living   6 2 2  4  4   2       Review of Systems   Review of Systems  Gen: no weight loss, fevers, chills, night sweats, + headache Eyes: no discharge or drainage, no occular pain or visual changes  Nose: no epistaxis or rhinorrhea  Mouth: no dental pain, no sore throat  Neck: no neck pain  Lungs:No wheezing, coughing or hemoptysis CV: no chest pain, palpitations, dependent edema or orthopnea  Abd: no abdominal pain, nausea, vomiting  GU: no dysuria or gross hematuria  MSK:  No abnormalities  Neuro: no headache, no focal neurologic deficits  Skin: no abnormalities Psyche: negative.    Allergies  Sulfonamide derivatives  Home Medications   Current Outpatient Rx  Name Route Sig Dispense Refill  . CYMBALTA 60 MG PO CPEP  TAKE ONE CAPSULE BY MOUTH ONE TIME DAILY 90 each 0  . DIPHENHYDRAMINE HCL 50 MG PO CAPS Oral Take 50 mg by mouth at bedtime as needed. For sleep    . ESOMEPRAZOLE MAGNESIUM 40 MG PO CPDR  TAKE 1 CAPSULE BY MOUTH ONCE DAILY 30 capsule 5  . LOPERAMIDE HCL 2 MG PO CAPS Oral Take 2 mg by mouth  daily as needed. For diarrhea    . METAXALONE 800 MG PO TABS Oral Take 800 mg by mouth 2 (two) times daily as needed. For muscle spasms    . NORETHINDRONE 0.35 MG PO TABS Oral Take 1 tablet by mouth daily.    . QUETIAPINE FUMARATE ER 300 MG PO TB24 Oral Take 300 mg by mouth at bedtime.    . TOPIRAMATE 100 MG PO TABS Oral Take 100 mg by mouth daily.       BP 123/90  Pulse 76  Resp 20  SpO2 100%  Physical Exam  Nursing note and vitals reviewed. Constitutional: She is oriented to person, place, and time. She appears well-developed and well-nourished. No distress.  HENT:  Head: Normocephalic and atraumatic.  Eyes: Pupils are equal, round, and reactive to light.  Neck: Normal range of motion. Neck supple.    Cardiovascular: Normal rate and regular rhythm.   Pulmonary/Chest: Effort normal.  Abdominal: Soft.  Neurological: She is alert and oriented to person, place, and time. She has normal strength. No cranial nerve deficit (3-12 intact) or sensory deficit. She displays a negative Romberg sign. GCS eye subscore is 4. GCS verbal subscore is 5. GCS motor subscore is 6.  Skin: Skin is warm and dry. She is not diaphoretic.    ED Course  Procedures (including critical care time)  Labs Reviewed - No data to display No results found.   1. Migraine       MDM   Pt had normal head CT two months ago and norma BP here in triage. Normal headache exacerbation, i do not feel that head CT is needed at this time.   Patient given IV normal saline, Reglan, Decadron. She is states that she felt sleepy but was not having resolution of the pain. Therefore Toradol and morphine were added on.  Patients pain has gone from a 10/10 to a 0/10. She has had complete resolution of her pain. Repeat neuro exam shows no deficits.   Patient says " i am ready to go home now".  She [facility] at the [facility]. Will gixe small Rx for Vicodin and refer her back to the HA clinic.  Pt has been advised of the symptoms that warrant their return to the ED. Patient has voiced understanding and has agreed to follow-up with the PCP or specialist.       Dorthula Matas, PA 07/05/12 [phone]

## 2012-07-13 IMAGING — CR DG HAND COMPLETE 3+V*R*
3 series · 3 of 3 positions shown · non-contrast
Comparison: None.

CLINICAL DATA: Fell.  Pain.  Pain at base of second digit and
lateral aspect of the hand.

RIGHT HAND - COMPLETE 3+ VIEW

[view not recorded (1 of 3)]
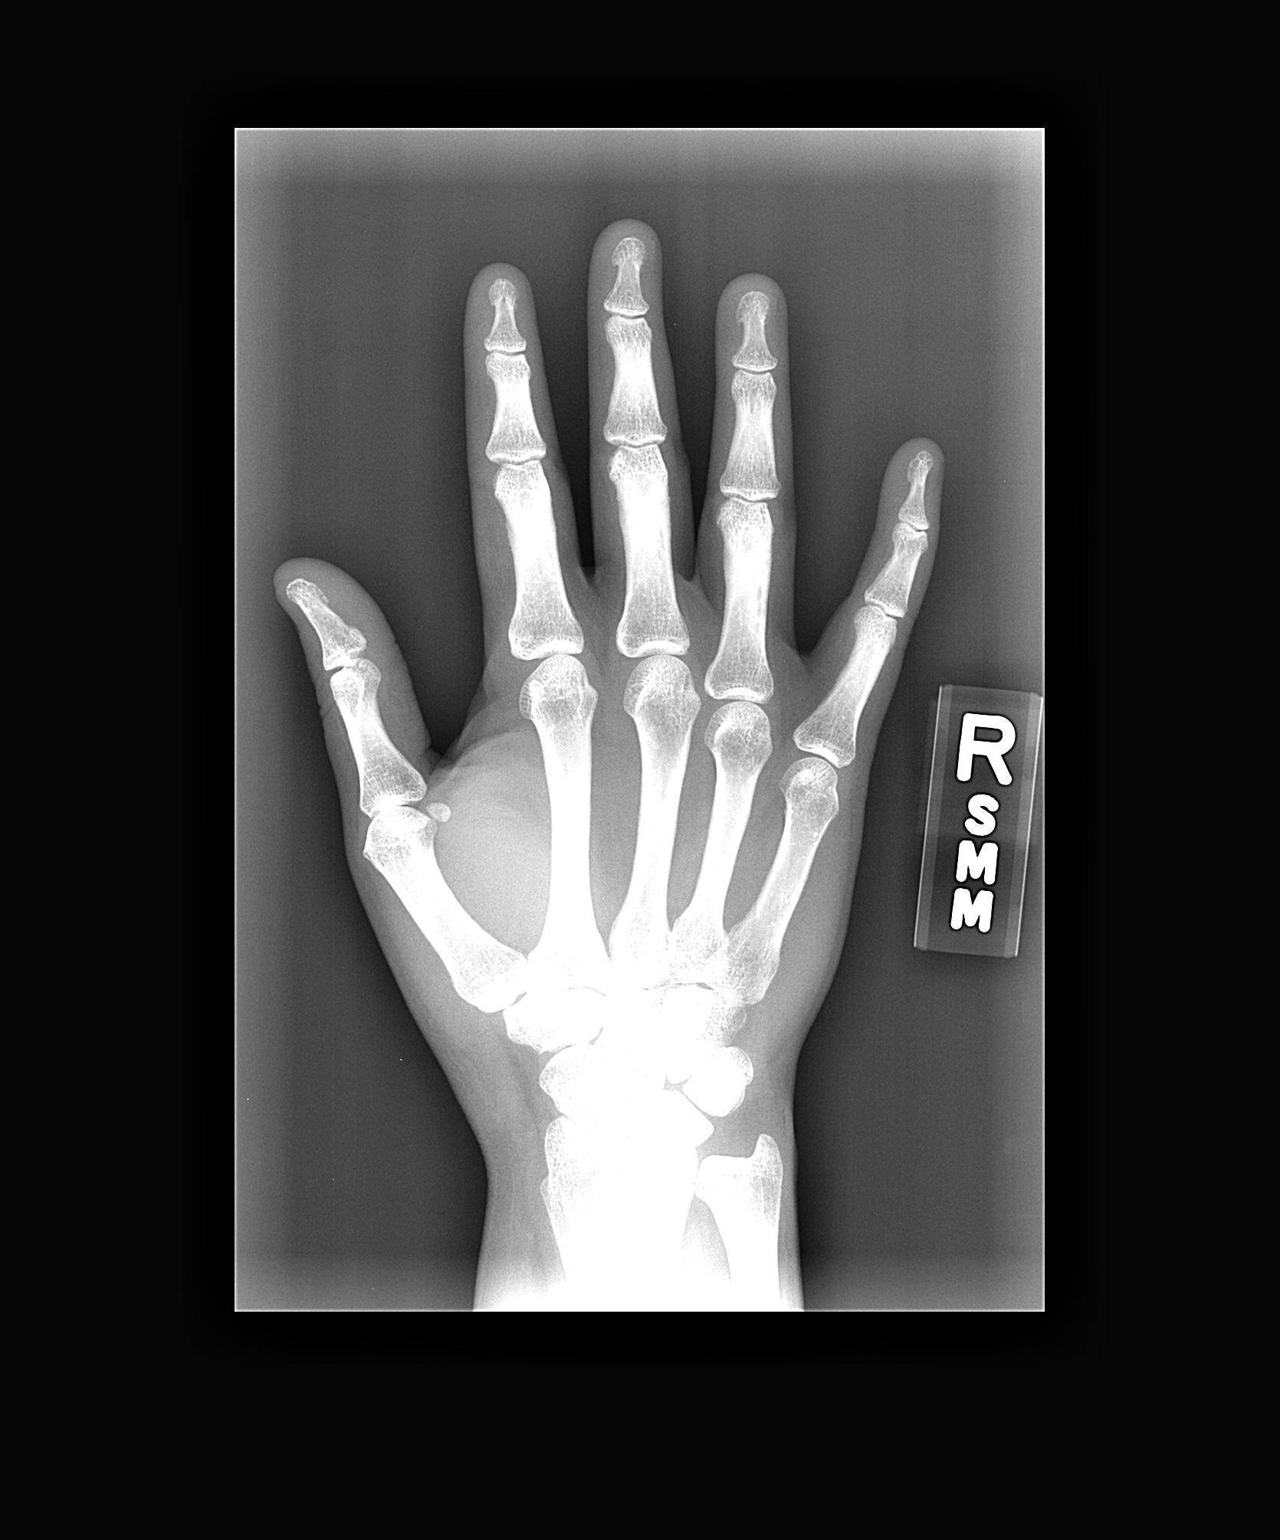

[view not recorded (2 of 3)]
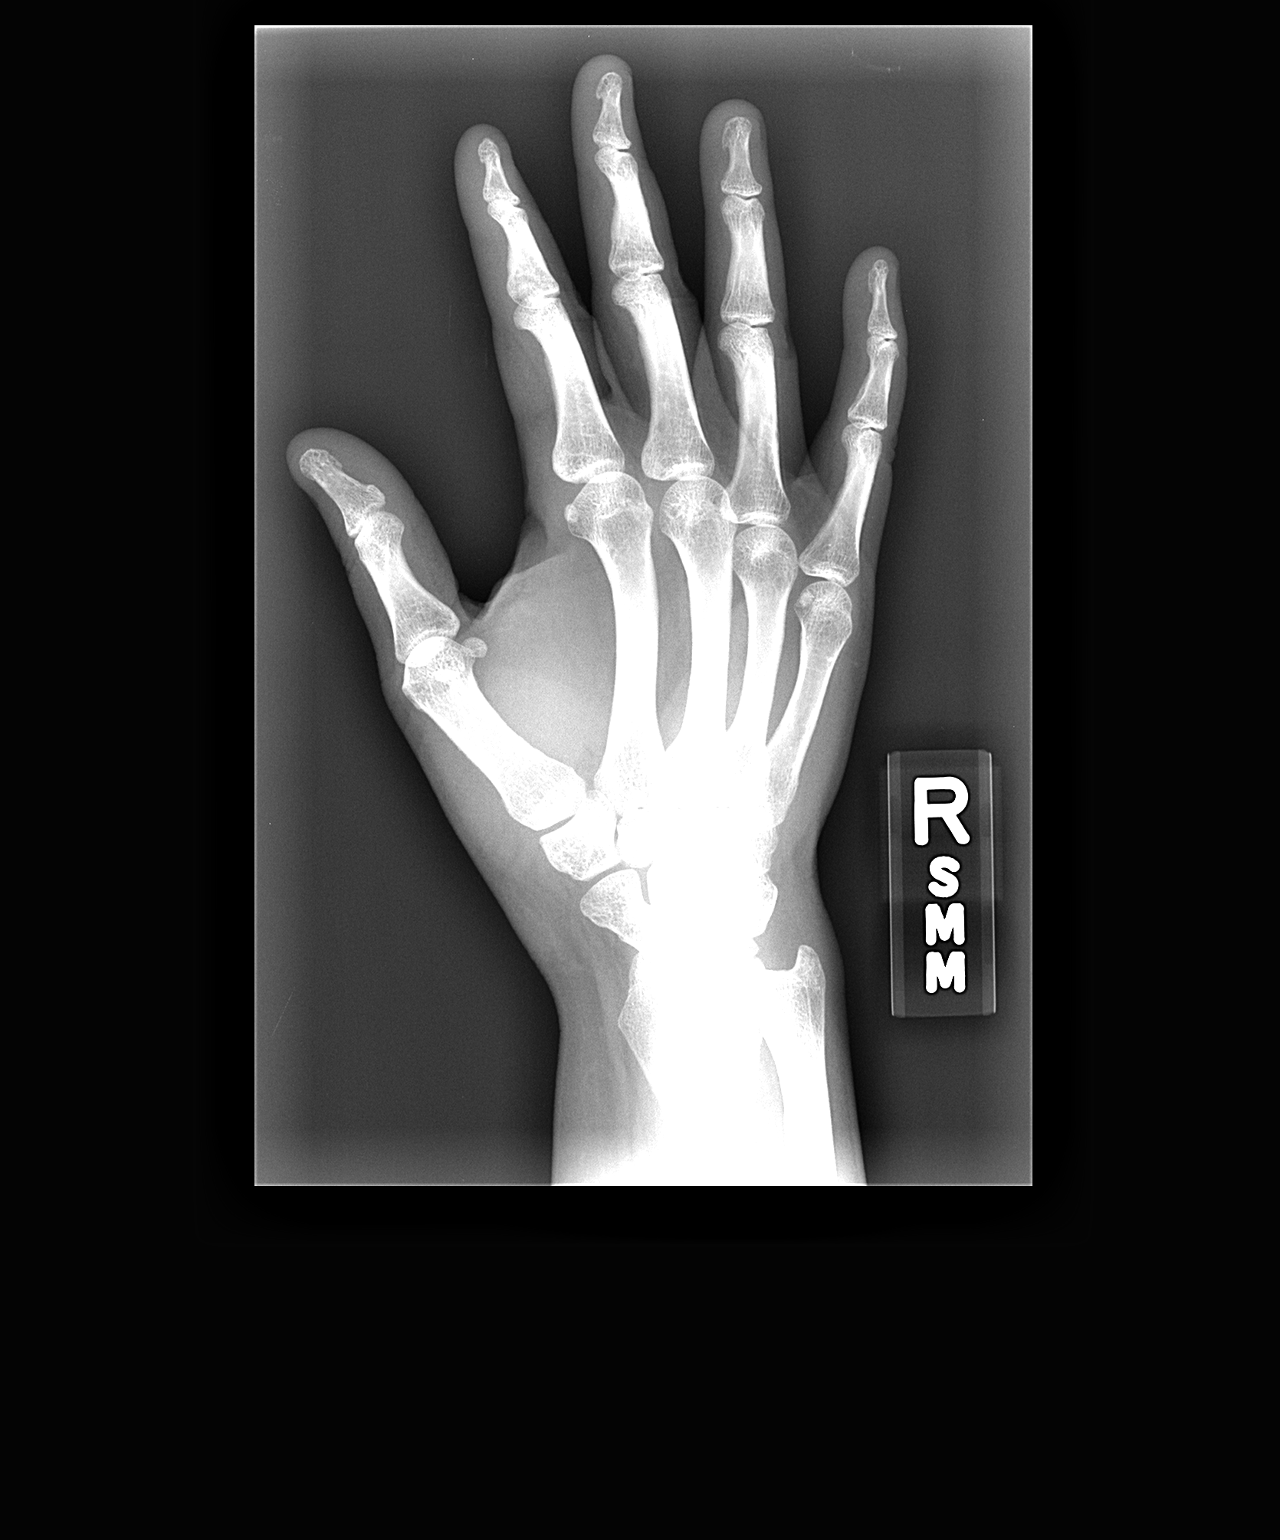

[view not recorded (3 of 3)]
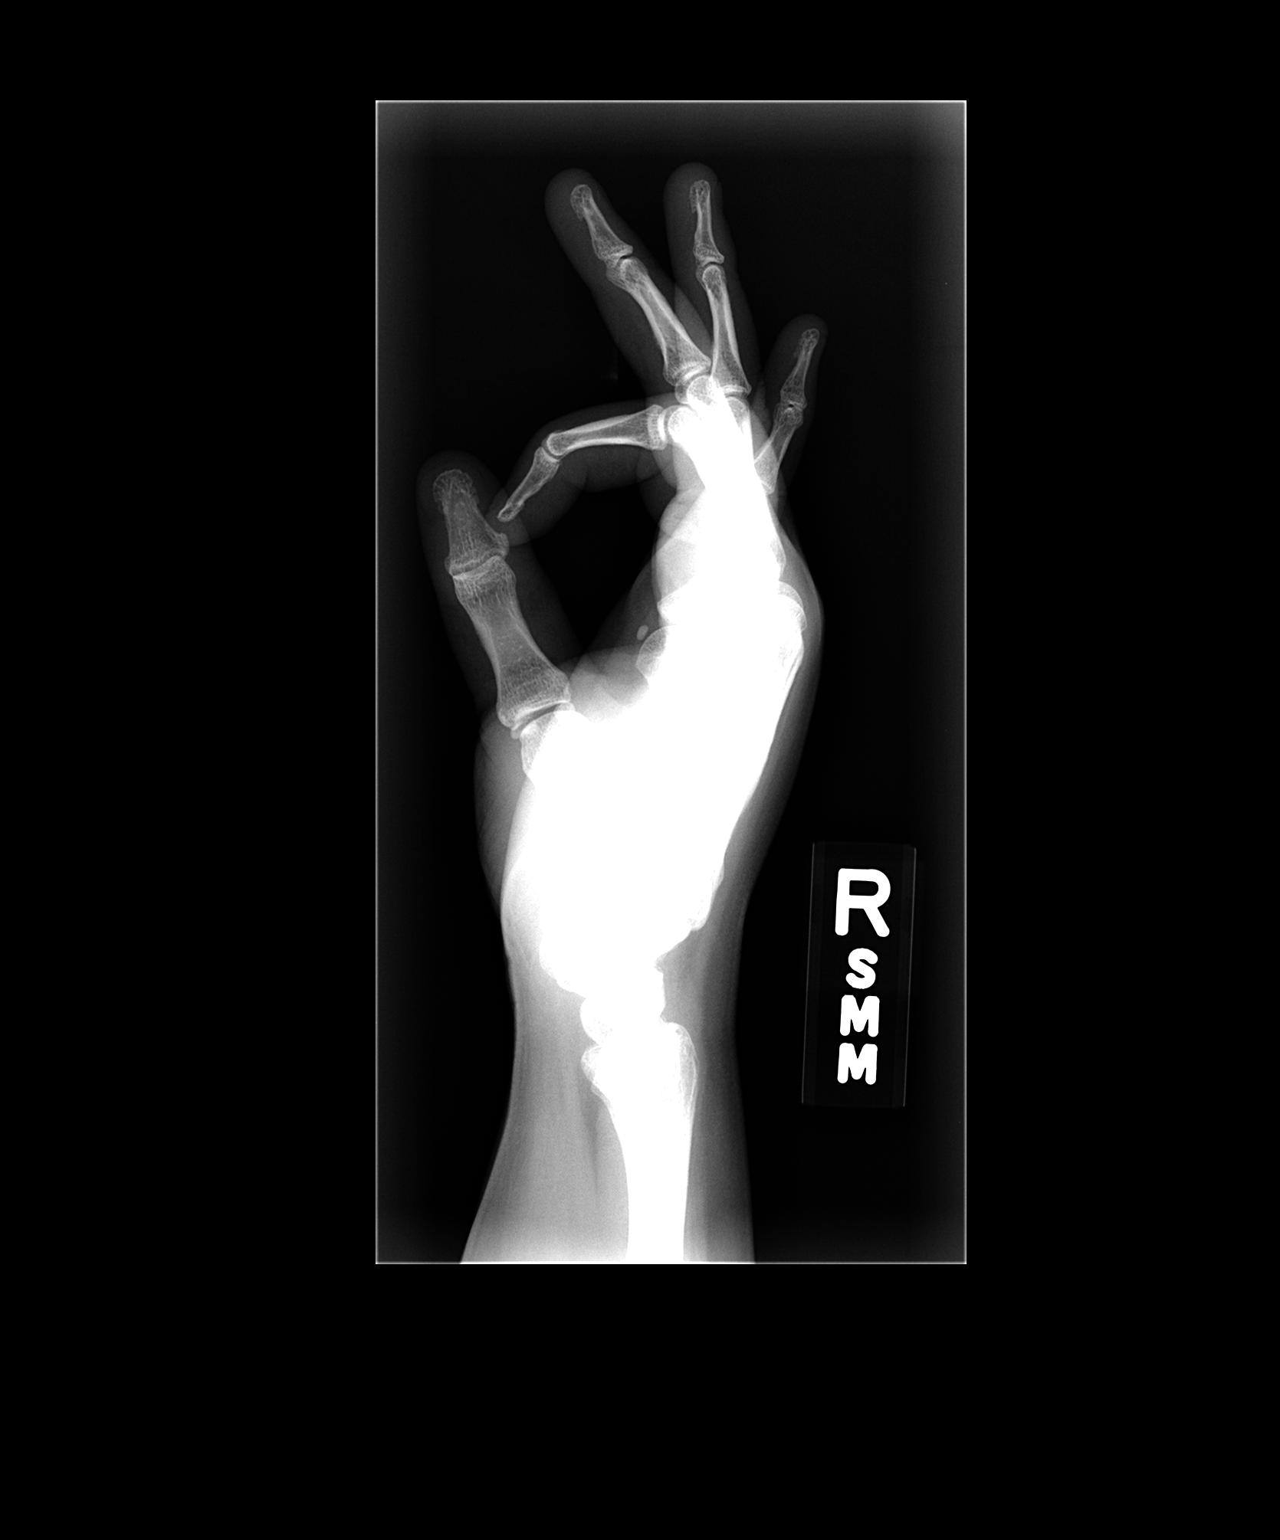

[3 of 3 positions shown; findings below may reference images not displayed]

FINDINGS: There is no evidence for acute fracture or dislocation.
No soft tissue foreign body or gas identified.
IMPRESSION: Negative exam.

## 2012-07-13 IMAGING — CR DG WRIST COMPLETE 3+V*R*
1 series · 1 of 1 positions shown · non-contrast
Comparison: None

CLINICAL DATA: Fall with right wrist injury and pain.

RIGHT WRIST - COMPLETE 3+ VIEW

[view not recorded]
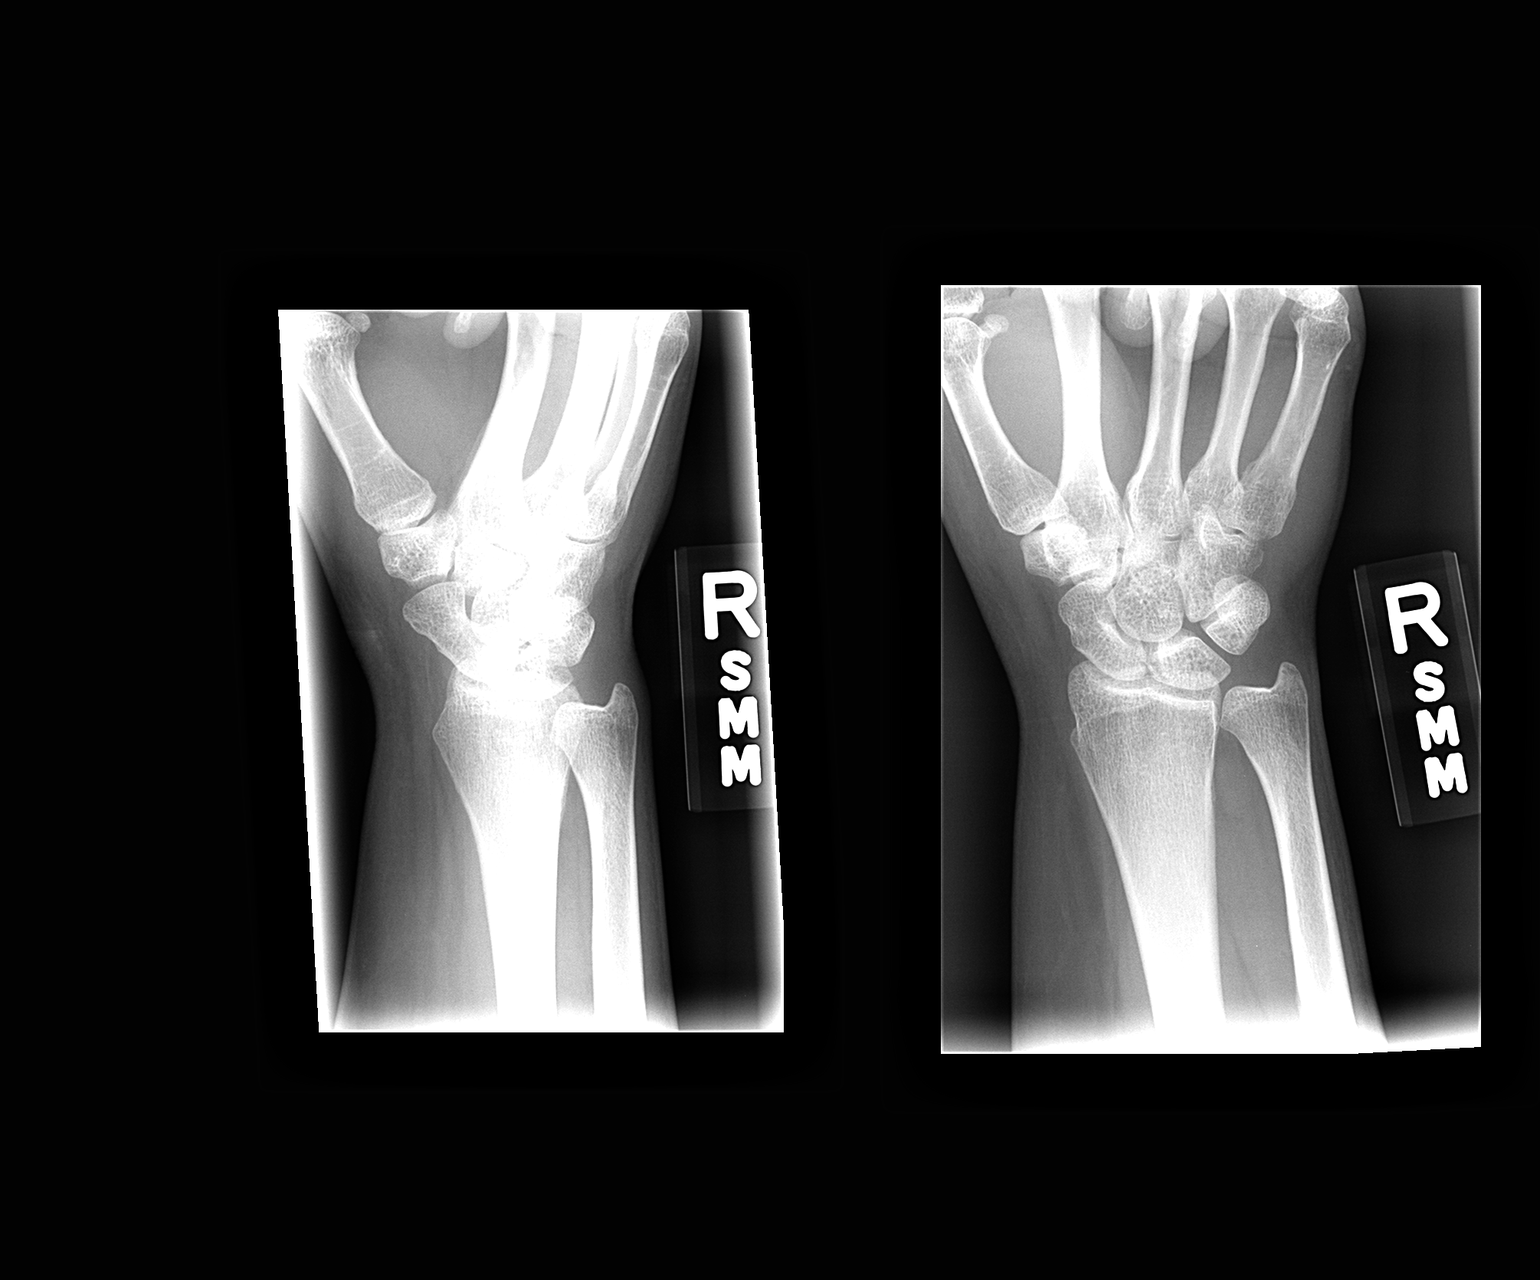

[1 of 1 positions shown; findings below may reference images not displayed]

FINDINGS: No evidence of acute fracture, subluxation or dislocation
identified.

No radio-opaque foreign bodies are present.

No focal bony lesions are noted.

The joint spaces are unremarkable.
IMPRESSION: No acute bony abnormalities.

## 2012-08-21 ENCOUNTER — Other Ambulatory Visit: Payer: Self-pay | Admitting: Internal Medicine

## 2012-08-25 ENCOUNTER — Ambulatory Visit (INDEPENDENT_AMBULATORY_CARE_PROVIDER_SITE_OTHER): Payer: BC Managed Care – PPO | Admitting: Emergency Medicine

## 2012-08-25 VITALS — BP 109/76 | HR 88 | Temp 98.4°F | Resp 17 | Ht 64.5 in | Wt 229.0 lb

## 2012-08-25 DIAGNOSIS — R21 Rash and other nonspecific skin eruption: Secondary | ICD-10-CM

## 2012-08-25 DIAGNOSIS — L309 Dermatitis, unspecified: Secondary | ICD-10-CM

## 2012-08-25 DIAGNOSIS — L259 Unspecified contact dermatitis, unspecified cause: Secondary | ICD-10-CM

## 2012-08-25 MED ORDER — TRIAMCINOLONE ACETONIDE 0.1 % EX CREA
TOPICAL_CREAM | Freq: Two times a day (BID) | CUTANEOUS | Status: DC
Start: 2012-08-25 — End: 2012-11-08

## 2012-08-25 NOTE — Progress Notes (Signed)
[facility] [address] [place] [address] [phone]  Date:  08/25/2012   Name:  Paula Horne   DOB:  [birth year]   MRN:  [id number]  PCP:  Marga Melnick, MD    Chief Complaint: Rash   History of Present Illness:  Paula Horne is a 41 y.o. very pleasant female patient who presents with the following:  Has rash on left neck and less on right neck that has been present for 3 weeks.  Denies new allergen contact.  Has used any number of home remedies on the rash with no resolution.  Thinks it may have begun as a vesicular eruption.  Denies jewelry wear.  Patient Active Problem List  Diagnosis  . HEMANGIOMA, HEPATIC  . DEPRESSIVE DISORDER NOT ELSEWHERE CLASSIFIED  . MIGRAINE  . ESOPHAGEAL STRICTURE  . GERD  . DYSPEPSIA  . SLEEP DISORDER, CHRONIC  . FATIGUE    Past Medical History  Diagnosis Date  . Migraine   . GERD (gastroesophageal reflux disease)   . Sinusitis   . PONV (postoperative nausea and vomiting)   . Depression   . Anemia   . Acid reflux   . Cancer of skin of back     Past Surgical History  Procedure Date  . Cholecystectomy 2001  . Esophageal dilation 04/2007    Dr.Kaplan    History  Substance Use Topics  . Smoking status: Never Smoker   . Smokeless tobacco: Not on file  . Alcohol Use: No    Family History  Problem Relation Age of Onset  . Depression Father   . Skin cancer Father   . Colon cancer Maternal Uncle   . Depression Mother   . Alcohol abuse Brother   . Alcohol abuse Maternal Grandmother   . Stroke Maternal Grandmother   . Alcohol abuse Maternal Grandfather   . Coronary artery disease Paternal Grandmother   . Cancer Other     ovarian    Allergies  Allergen Reactions  . Sulfonamide Derivatives     REACTION: diffuse rash; no Levonne Spiller Syndrome  phenomena    Medication list has been reviewed and updated.  Current Outpatient Prescriptions on File Prior to Visit  Medication Sig  Dispense Refill  . CYMBALTA 60 MG capsule TAKE ONE CAPSULE BY MOUTH ONE TIME DAILY  90 each  0  . esomeprazole (NEXIUM) 40 MG capsule TAKE 1 CAPSULE BY MOUTH ONCE DAILY  30 capsule  5  . loperamide (IMODIUM) 2 MG capsule Take 2 mg by mouth daily as needed. For diarrhea      . metaxalone (SKELAXIN) 800 MG tablet Take 800 mg by mouth 2 (two) times daily as needed. For muscle spasms      . NEXIUM 40 MG capsule TAKE 1 CAPSULE BY MOUTH ONCE DAILY  30 capsule  3  . norethindrone (MICRONOR,CAMILA,ERRIN) 0.35 MG tablet Take 1 tablet by mouth daily.      . QUEtiapine (SEROQUEL XR) 300 MG 24 hr tablet Take 300 mg by mouth at bedtime.      . topiramate (TOPAMAX) 100 MG tablet Take 100 mg by mouth daily.       . diphenhydrAMINE (BENADRYL) 50 MG capsule Take 50 mg by mouth at bedtime as needed. For sleep      . HYDROcodone-acetaminophen (VICODIN) 5-500 MG per tablet Take 1-2 tablets by mouth every 6 (six) hours as needed for pain.  8 tablet  0  . ondansetron (ZOFRAN) 4 MG  tablet Take 1 tablet (4 mg total) by mouth every 6 (six) hours.  12 tablet  0    Review of Systems:  As per HPI, otherwise negative.    Physical Examination: Filed Vitals:   08/25/12 1602  BP: 109/76  Pulse: 88  Temp: 98.4 F (36.9 C)  Resp: 17   Filed Vitals:   08/25/12 1602  Height: 5' 4.5" (1.638 m)  Weight: 229 lb (103.874 kg)   Body mass index is 38.70 kg/(m^2). Ideal Body Weight:    GEN: WDWN, NAD, Non-toxic, Alert & Oriented x 3 HEENT: Atraumatic, Normocephalic.  Ears and Nose: No external deformity. EXTR: No clubbing/cyanosis/edema NEURO: Normal gait.  PSYCH: Normally interactive. Conversant. Not depressed or anxious appearing.  Calm demeanor.  SKIN:  Scaly excoriated erythematous rash on anterior neck base much larger area on left than right.  No cellulitis  Assessment and Plan: rash  Carmelina Dane, MD

## 2012-09-01 NOTE — Progress Notes (Signed)
Reviewed and agree.

## 2012-09-01 NOTE — Progress Notes (Deleted)
  Subjective:    Patient ID: Paula Horne, female    DOB: 22-Jul-1971, 41 y.o.   MRN: [id number]  HPI    Review of Systems     Objective:   Physical Exam        Assessment & Plan:

## 2012-11-08 ENCOUNTER — Encounter: Payer: Self-pay | Admitting: Internal Medicine

## 2012-11-08 ENCOUNTER — Ambulatory Visit (INDEPENDENT_AMBULATORY_CARE_PROVIDER_SITE_OTHER): Payer: BC Managed Care – PPO | Admitting: Internal Medicine

## 2012-11-08 ENCOUNTER — Telehealth: Payer: Self-pay

## 2012-11-08 VITALS — BP 104/78 | HR 79 | Temp 97.9°F | Wt 221.0 lb

## 2012-11-08 DIAGNOSIS — M79641 Pain in right hand: Secondary | ICD-10-CM

## 2012-11-08 DIAGNOSIS — M79609 Pain in unspecified limb: Secondary | ICD-10-CM

## 2012-11-08 DIAGNOSIS — M25531 Pain in right wrist: Secondary | ICD-10-CM

## 2012-11-08 DIAGNOSIS — K219 Gastro-esophageal reflux disease without esophagitis: Secondary | ICD-10-CM

## 2012-11-08 DIAGNOSIS — R209 Unspecified disturbances of skin sensation: Secondary | ICD-10-CM

## 2012-11-08 DIAGNOSIS — K222 Esophageal obstruction: Secondary | ICD-10-CM

## 2012-11-08 DIAGNOSIS — R202 Paresthesia of skin: Secondary | ICD-10-CM

## 2012-11-08 DIAGNOSIS — M25539 Pain in unspecified wrist: Secondary | ICD-10-CM

## 2012-11-08 DIAGNOSIS — M25532 Pain in left wrist: Secondary | ICD-10-CM

## 2012-11-08 LAB — URIC ACID: Uric Acid, Serum: 4.8 mg/dL (ref 2.4–7.0)

## 2012-11-08 LAB — RHEUMATOID FACTOR: Rhuematoid fact SerPl-aCnc: <10 IU/mL (ref <=14)

## 2012-11-08 MED ORDER — DICLOFENAC SODIUM 1 % TD GEL: 2.0000 g | Freq: Two times a day (BID) | TRANSDERMAL | Status: DC

## 2012-11-08 NOTE — Telephone Encounter (Signed)
Manual fax received from [employer] [facility] [place], Voltaren Gel requires a Prior Authorization [id number], Patient ID: [id number] I called to initiate PA, after waiting for 13 min phone call ended, I will try again later

## 2012-11-08 NOTE — Patient Instructions (Addendum)
Review and correct the record as indicated. Please share record with all medical staff seen.   11/13/12   Creig Hines, CMA spent 13 minutes one day and 23 minutes today attempting to preauthorize Voltaren gel. Today after talking with 3 different people she was told that they could not complete preauthorization process because   their computers were being updated.  Mrs Favor was informed of this process & told to use an anti-inflammatory cream such as Aspercreme or Zostrix cream twice a day to the affected joints as needed. In lieu of this warm moist compresses or  hot water bottle can be used.She was told not to apply ice .    This "preauthorization" process has proven incredibly inefficient, time consuming for staff and expensive to our practice. [employer] estimates this process costs each individual physician over $3000 a year. This would place the cost to [employer] at several hundred thousand dollars.  It is recommended that system wide [place] develop a  central reference file for each managed-care program containing their standardized forms; fax numbers; and  contact phone numbers. The goal would be to handle as much of this via fax rather than tying up our staff on the phone for periods of 30 minutes or more.  Additionally it should be the responsibility of the patient to contact the company for the current alternative medications for the drug which is being requested. The patient should also provide a list of prior therapies & response or adverse efefcts to those agents. Their pharmacy should have such on file.  In lieu of this the patient should be asked to make an office visit to research the medical record to develop documentation to support authorization of the requested medication.

## 2012-11-08 NOTE — Progress Notes (Signed)
  Subjective:    Patient ID: Paula Horne, female    DOB: 1971/05/22, 42 y.o.   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 Pertinent data: Mother had "arthritis"    Review of Systems Constitutional: no fever, chills, sweats, change in weight  Musculoskeletal:no  muscle cramps or pain Skin:no rash, temp change Neuro: no weakness; incontinence (stool/urine) Heme:no lymphadenopathy; abnormal bruising or bleeding        Objective:   Physical Exam Gen.:  well-nourished in appearance. Alert, appropriate and cooperative throughout exam. Appears younger than stated age  Head: Normocephalic without obvious abnormalities Eyes: No corneal or conjunctival inflammation noted. Unsustained vertical nystagmus. No proptosis or lid lag.Extraocular motion intact.   Mouth: Oral mucosa and oropharynx reveal no lesions or exudates. Teeth in good repair. Neck: No deformities, masses, or tenderness noted. Range of motion & Thyroid normal.                                    Musculoskeletal/extremities:Accentuated curvature of upper thoracic  spine. . No clubbing, cyanosis, edema, or significant extremity  deformity noted. Range of motion normal .Tone & strength  normal.Joints normal. Nail health good. Able to lie down & sit up w/o help. Negative SLR bilaterally Vascular: Radial artery pulses are full and equal.  Neurologic: Alert and oriented x3. Deep tendon reflexes symmetrical and normal.  Subjective paresthesias in the fingers described prior to maneuvers to induce carpal tunnel syndrome. Tinel sign negative bilaterally Skin: Intact without suspicious lesions or rashes. Lymph: No cervical, axillary lymphadenopathy present. Psych: Mood and affect are normal. Normally interactive                                                                                         Assessment & Plan:  #1 wrist and hand pain with swelling and redness. Family history of arthritis; no definite diagnosis  #2 paresthesias of the hands; rule out carpal tunnel syndrome  #3 past history of esophageal reflux with esophageal stricture. Oral nonsteroidals contraindicated  Plan: See orders and recommendations

## 2012-11-11 ENCOUNTER — Encounter: Payer: Self-pay | Admitting: Internal Medicine

## 2012-11-13 NOTE — Telephone Encounter (Signed)
Called to initiate PA, after 23 minutes of holding and being transferred x 3, I was told computer system is updating and the company is unable to process PA at the time of call and I could try back later.

## 2012-11-15 NOTE — Telephone Encounter (Signed)
Patient was informed she needs to contact her insurance company and see what they will cover

## 2012-11-30 ENCOUNTER — Other Ambulatory Visit: Payer: Self-pay

## 2012-12-28 ENCOUNTER — Other Ambulatory Visit: Payer: Self-pay | Admitting: Internal Medicine

## 2013-01-02 ENCOUNTER — Ambulatory Visit (INDEPENDENT_AMBULATORY_CARE_PROVIDER_SITE_OTHER): Payer: BC Managed Care – PPO | Admitting: Emergency Medicine

## 2013-01-02 VITALS — BP 110/80 | HR 72 | Temp 98.1°F | Resp 16 | Ht 64.0 in | Wt 214.4 lb

## 2013-01-02 DIAGNOSIS — J020 Streptococcal pharyngitis: Secondary | ICD-10-CM

## 2013-01-02 DIAGNOSIS — J029 Acute pharyngitis, unspecified: Secondary | ICD-10-CM

## 2013-01-02 DIAGNOSIS — R110 Nausea: Secondary | ICD-10-CM

## 2013-01-02 LAB — POCT RAPID STREP A (OFFICE): Rapid Strep A Screen: NEGATIVE

## 2013-01-02 MED ORDER — ONDANSETRON 8 MG PO TBDP: 8.0000 mg | ORAL_TABLET | Freq: Two times a day (BID) | ORAL | Status: DC | PRN

## 2013-01-02 NOTE — Progress Notes (Signed)
  Subjective:    Patient ID: Paula Horne, female    DOB: 04/10/1971, 42 y.o.   MRN: [id number]  HPI Patient presents today with sore throat and abdominal pain. Sore throat has lasted for 5 days. She has had abdominal pain also for the last 5 days. She started vomiting this morning. She has not had a fever. 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 She states she has been trying to drink plenty of fluids.    Review of Systems     Objective:   Physical Exam HEENT exam is unremarkable. Except for a consult with which is about 4 x 6 mm superior left tonsil. The throat is otherwise normal neck is supple chest clear abdomen is soft and nontender. Cardiac exam is unremarkable.    Results for orders placed in visit on 01/02/13  POCT RAPID STREP A (OFFICE)      Result Value Range   Rapid Strep A Screen Negative  Negative      Assessment & Plan:

## 2013-03-17 ENCOUNTER — Other Ambulatory Visit: Payer: Self-pay | Admitting: Obstetrics and Gynecology

## 2013-03-17 ENCOUNTER — Other Ambulatory Visit: Payer: Self-pay

## 2013-03-17 ENCOUNTER — Other Ambulatory Visit (HOSPITAL_COMMUNITY)
Admission: RE | Admit: 2013-03-17 | Discharge: 2013-03-17 | Disposition: A | Discharge status: Home / Self Care | Payer: BC Managed Care – PPO | Source: Ambulatory Visit | Attending: Obstetrics and Gynecology | Admitting: Obstetrics and Gynecology

## 2013-03-17 DIAGNOSIS — Z01419 Encounter for gynecological examination (general) (routine) without abnormal findings: Secondary | ICD-10-CM | POA: Insufficient documentation

## 2013-03-17 DIAGNOSIS — Z1231 Encounter for screening mammogram for malignant neoplasm of breast: Secondary | ICD-10-CM

## 2013-03-17 DIAGNOSIS — Z1151 Encounter for screening for human papillomavirus (HPV): Secondary | ICD-10-CM | POA: Insufficient documentation

## 2013-04-14 ENCOUNTER — Ambulatory Visit
Admission: RE | Admit: 2013-04-14 | Discharge: 2013-04-14 | Disposition: A | Discharge status: Home / Self Care | Payer: BC Managed Care – PPO | Source: Ambulatory Visit

## 2013-04-14 DIAGNOSIS — Z1231 Encounter for screening mammogram for malignant neoplasm of breast: Secondary | ICD-10-CM

## 2013-06-27 ENCOUNTER — Other Ambulatory Visit: Payer: Self-pay | Admitting: Internal Medicine

## 2013-07-02 ENCOUNTER — Other Ambulatory Visit: Payer: Self-pay | Admitting: *Deleted

## 2013-07-02 NOTE — Telephone Encounter (Signed)
A prior auth was requested for Nexium 40 mg 07/02/2013.  Prior Berkley Harvey has been started and I'm waiting on paper work to be faxed in order to continue.   Ag cma

## 2013-07-08 ENCOUNTER — Telehealth: Payer: Self-pay | Admitting: General Practice

## 2013-07-08 NOTE — Telephone Encounter (Signed)
PA for nexium started on 9/23. Faxed to [phone]

## 2013-07-09 NOTE — Telephone Encounter (Signed)
PA has been approved  Effective from 06/17/13 through 07/08/14 case ID [id number]. Approval letter sent to be scanned.     KP

## 2013-07-13 ENCOUNTER — Ambulatory Visit (INDEPENDENT_AMBULATORY_CARE_PROVIDER_SITE_OTHER): Payer: BC Managed Care – PPO | Admitting: Emergency Medicine

## 2013-07-13 VITALS — BP 134/86 | HR 94 | Temp 98.8°F | Resp 18 | Ht 64.0 in | Wt 214.0 lb

## 2013-07-13 DIAGNOSIS — G43109 Migraine with aura, not intractable, without status migrainosus: Secondary | ICD-10-CM

## 2013-07-13 MED ORDER — KETOROLAC TROMETHAMINE 60 MG/2ML IM SOLN
60.0000 mg | Freq: Once | INTRAMUSCULAR | Status: AC
  Administered 2013-07-13: 60 mg via INTRAMUSCULAR

## 2013-07-13 MED ORDER — PROMETHAZINE HCL 25 MG/ML IJ SOLN
50.0000 mg | Freq: Once | INTRAMUSCULAR | Status: AC
  Administered 2013-07-13: 50 mg via INTRAMUSCULAR

## 2013-07-13 NOTE — Progress Notes (Signed)
[facility] [address], [place] [address] [phone]  Date:  07/13/2013   Name:  Paula Horne   DOB:  1971-09-20   MRN:  [id number]  PCP:  Marga Melnick, MD    Chief Complaint: Migraine   History of Present Illness:  Paula Horne is a 42 y.o. very pleasant female patient who presents with the following:  Ill all night with nausea and diarrhea.  Has a migraine headache that she describes as rather severe. Up all night with no sleep.  No antecedent illness or injury, fever or chills.  No cough or coryza. No neuro or visual symptoms.  Took her prescribed skelaxin and 50 mg of librium with no impact.  No improvement with over the counter medications or other home remedies. Denies other complaint or health concern today.   Not currently on a triptan as they were discontinued in the past.  Patient Active Problem List   Diagnosis Date Noted  . FATIGUE 11/01/2009  . SLEEP DISORDER, CHRONIC 11/03/2008  . HEMANGIOMA, HEPATIC 01/28/2008  . ESOPHAGEAL STRICTURE 01/28/2008  . DYSPEPSIA 01/28/2008  . DEPRESSIVE DISORDER NOT ELSEWHERE CLASSIFIED 01/27/2008  . GERD 01/27/2008  . MIGRAINE 08/12/2007    Past Medical History  Diagnosis Date  . Migraine   . GERD (gastroesophageal reflux disease)   . Sinusitis   . PONV (postoperative nausea and vomiting)   . Depression   . Anemia   . Acid reflux   . Cancer of skin of back     basal cell; [facility]    Past Surgical History  Procedure Laterality Date  . Cholecystectomy  2001  . Esophageal dilation  04/2007    Dr.Kaplan    History  Substance Use Topics  . Smoking status: Never Smoker   . Smokeless tobacco: Not on file  . Alcohol Use: No    Family History  Problem Relation Age of Onset  . Depression Father   . Skin cancer Father   . Colon cancer Maternal Uncle   . Depression Mother   . Arthritis Mother     ? type  . Alcohol abuse Brother   . Alcohol abuse Maternal Grandmother   . Stroke  Maternal Grandmother   . Alcohol abuse Maternal Grandfather   . Coronary artery disease Paternal Grandmother   . Ovarian cancer Paternal Grandmother     Allergies  Allergen Reactions  . Sulfonamide Derivatives     REACTION: diffuse rash; no Levonne Spiller Syndrome  phenomena    Medication list has been reviewed and updated.  Current Outpatient Prescriptions on File Prior to Visit  Medication Sig Dispense Refill  . chlordiazePOXIDE (LIBRIUM) 25 MG capsule Take 25 mg by mouth. 1-2 as needed for Headaches, may Repeat every 2-3 hours if needed. Maz 8 cap/24H, Rx'ed by Dr.Freeman      . CYMBALTA 60 MG capsule TAKE ONE CAPSULE BY MOUTH ONE TIME DAILY  90 each  0  . diclofenac sodium (VOLTAREN) 1 % GEL Apply 2 g topically 2 (two) times daily.  100 g  1  . metaxalone (SKELAXIN) 800 MG tablet Take 800 mg by mouth 2 (two) times daily as needed. For muscle spasms      . NEXIUM 40 MG capsule Take one capsule by mouth one time daily  30 capsule  4  . norethindrone (MICRONOR,CAMILA,ERRIN) 0.35 MG tablet Take 1 tablet by mouth daily.      Marland Kitchen topiramate (TOPAMAX) 100 MG tablet Take 100  mg by mouth daily.       . ziprasidone (GEODON) 60 MG capsule Take 60 mg by mouth daily.      Marland Kitchen HYDROcodone-acetaminophen (VICODIN) 5-500 MG per tablet Take 1-2 tablets by mouth every 6 (six) hours as needed for pain.  8 tablet  0  . ondansetron (ZOFRAN-ODT) 8 MG disintegrating tablet Take 1 tablet (8 mg total) by mouth every 12 (twelve) hours as needed for nausea.  12 tablet  0  . QUEtiapine (SEROQUEL XR) 300 MG 24 hr tablet Take 300 mg by mouth at bedtime.       No current facility-administered medications on file prior to visit.    Review of Systems:  As per HPI, otherwise negative.    Physical Examination: Filed Vitals:   07/13/13 1234  BP: 134/86  Pulse: 94  Temp: 98.8 F (37.1 C)  Resp: 18   Filed Vitals:   07/13/13 1234  Height: 5\' 4"  (1.626 m)  Weight: 214 lb (97.07 kg)   Body mass index is  36.72 kg/(m^2). Ideal Body Weight: Weight in (lb) to have BMI = 25: 145.3  GEN: obese, moderate distress, Non-toxic, A & O x 3 HEENT: Atraumatic, Normocephalic. Neck supple. No masses, No LAD. Ears and Nose: No external deformity. CV: RRR, No M/G/R. No JVD. No thrill. No extra heart sounds. PULM: CTA B, no wheezes, crackles, rhonchi. No retractions. No resp. distress. No accessory muscle use. ABD: S, NT, ND, +BS. No rebound. No HSM. EXTR: No c/c/e NEURO Normal gait.  PSYCH: Normally interactive. Conversant. Not depressed or anxious appearing.  Calm demeanor.    Assessment and Plan: Migraine headache toradol 60  Phenergan 50 Signed,  Phillips Odor, MD

## 2013-07-13 NOTE — Patient Instructions (Signed)
Migraine Headache A migraine headache is an intense, throbbing pain on one or both sides of your head. A migraine can last for 30 minutes to several hours. CAUSES  The exact cause of a migraine headache is not always known. However, a migraine may be caused when nerves in the brain become irritated and release chemicals that cause inflammation. This causes pain. SYMPTOMS  Pain on one or both sides of your head.  Pulsating or throbbing pain.  Severe pain that prevents daily activities.  Pain that is aggravated by any physical activity.  Nausea, vomiting, or both.  Dizziness.  Pain with exposure to bright lights, loud noises, or activity.  General sensitivity to bright lights, loud noises, or smells. Before you get a migraine, you may get warning signs that a migraine is coming (aura). An aura may include:  Seeing flashing lights.  Seeing bright spots, halos, or zig-zag lines.  Having tunnel vision or blurred vision.  Having feelings of numbness or tingling.  Having trouble talking.  Having muscle weakness. MIGRAINE TRIGGERS  Alcohol.  Smoking.  Stress.  Menstruation.  Aged cheeses.  Foods or drinks that contain nitrates, glutamate, aspartame, or tyramine.  Lack of sleep.  Chocolate.  Caffeine.  Hunger.  Physical exertion.  Fatigue.  Medicines used to treat chest pain (nitroglycerine), birth control pills, estrogen, and some blood pressure medicines. DIAGNOSIS  A migraine headache is often diagnosed based on:  Symptoms.  Physical examination.  A CT scan or MRI of your head. TREATMENT Medicines may be given for pain and nausea. Medicines can also be given to help prevent recurrent migraines.  HOME CARE INSTRUCTIONS  Only take over-the-counter or prescription medicines for pain or discomfort as directed by your caregiver. The use of long-term narcotics is not recommended.  Lie down in a dark, quiet room when you have a migraine.  Keep a journal  to find out what may trigger your migraine headaches. For example, write down:  What you eat and drink.  How much sleep you get.  Any change to your diet or medicines.  Limit alcohol consumption.  Quit smoking if you smoke.  Get 7 to 9 hours of sleep, or as recommended by your caregiver.  Limit stress.  Keep lights dim if bright lights bother you and make your migraines worse. SEEK IMMEDIATE MEDICAL CARE IF:   Your migraine becomes severe.  You have a fever.  You have a stiff neck.  You have vision loss.  You have muscular weakness or loss of muscle control.  You start losing your balance or have trouble walking.  You feel faint or pass out.  You have severe symptoms that are different from your first symptoms. MAKE SURE YOU:   Understand these instructions.  Will watch your condition.  Will get help right away if you are not doing well or get worse. Document Released: 10/02/2005 Document Revised: 12/25/2011 Document Reviewed: 09/22/2011 ExitCare Patient Information 2014 [employer].  

## 2013-08-04 ENCOUNTER — Encounter (HOSPITAL_COMMUNITY): Payer: Self-pay

## 2013-08-04 ENCOUNTER — Inpatient Hospital Stay (HOSPITAL_COMMUNITY)
Admission: RE | Admit: 2013-08-04 | Discharge: 2013-08-08 | DRG: 885 | Disposition: A | Discharge status: Home / Self Care | Payer: BC Managed Care – PPO | Attending: Psychiatry | Admitting: Psychiatry

## 2013-08-04 ENCOUNTER — Encounter (HOSPITAL_COMMUNITY): Payer: Self-pay | Admitting: *Deleted

## 2013-08-04 ENCOUNTER — Emergency Department (HOSPITAL_COMMUNITY)
Admission: EM | Admit: 2013-08-04 | Discharge: 2013-08-04 | Disposition: A | Discharge status: Other Acute Inpatient Hospital | Payer: BC Managed Care – PPO | Attending: Emergency Medicine | Admitting: Emergency Medicine

## 2013-08-04 ENCOUNTER — Encounter (HOSPITAL_COMMUNITY): Payer: Self-pay | Admitting: Emergency Medicine

## 2013-08-04 DIAGNOSIS — Z862 Personal history of diseases of the blood and blood-forming organs and certain disorders involving the immune mechanism: Secondary | ICD-10-CM | POA: Insufficient documentation

## 2013-08-04 DIAGNOSIS — K219 Gastro-esophageal reflux disease without esophagitis: Secondary | ICD-10-CM | POA: Insufficient documentation

## 2013-08-04 DIAGNOSIS — T391X2D Poisoning by 4-Aminophenol derivatives, intentional self-harm, subsequent encounter: Secondary | ICD-10-CM

## 2013-08-04 DIAGNOSIS — R5381 Other malaise: Secondary | ICD-10-CM

## 2013-08-04 DIAGNOSIS — F332 Major depressive disorder, recurrent severe without psychotic features: Principal | ICD-10-CM | POA: Diagnosis present

## 2013-08-04 DIAGNOSIS — G479 Sleep disorder, unspecified: Secondary | ICD-10-CM

## 2013-08-04 DIAGNOSIS — F411 Generalized anxiety disorder: Secondary | ICD-10-CM | POA: Diagnosis present

## 2013-08-04 DIAGNOSIS — Z8679 Personal history of other diseases of the circulatory system: Secondary | ICD-10-CM | POA: Insufficient documentation

## 2013-08-04 DIAGNOSIS — Z791 Long term (current) use of non-steroidal anti-inflammatories (NSAID): Secondary | ICD-10-CM | POA: Insufficient documentation

## 2013-08-04 DIAGNOSIS — T391X2A Poisoning by 4-Aminophenol derivatives, intentional self-harm, initial encounter: Secondary | ICD-10-CM

## 2013-08-04 DIAGNOSIS — Z85828 Personal history of other malignant neoplasm of skin: Secondary | ICD-10-CM | POA: Insufficient documentation

## 2013-08-04 DIAGNOSIS — K3189 Other diseases of stomach and duodenum: Secondary | ICD-10-CM

## 2013-08-04 DIAGNOSIS — R45851 Suicidal ideations: Secondary | ICD-10-CM

## 2013-08-04 DIAGNOSIS — F329 Major depressive disorder, single episode, unspecified: Secondary | ICD-10-CM | POA: Insufficient documentation

## 2013-08-04 DIAGNOSIS — Z79899 Other long term (current) drug therapy: Secondary | ICD-10-CM

## 2013-08-04 DIAGNOSIS — T391X1A Poisoning by 4-Aminophenol derivatives, accidental (unintentional), initial encounter: Secondary | ICD-10-CM | POA: Insufficient documentation

## 2013-08-04 DIAGNOSIS — T394X2A Poisoning by antirheumatics, not elsewhere classified, intentional self-harm, initial encounter: Secondary | ICD-10-CM | POA: Insufficient documentation

## 2013-08-04 DIAGNOSIS — F3289 Other specified depressive episodes: Secondary | ICD-10-CM | POA: Insufficient documentation

## 2013-08-04 DIAGNOSIS — K222 Esophageal obstruction: Secondary | ICD-10-CM

## 2013-08-04 DIAGNOSIS — D1803 Hemangioma of intra-abdominal structures: Secondary | ICD-10-CM

## 2013-08-04 DIAGNOSIS — G43909 Migraine, unspecified, not intractable, without status migrainosus: Secondary | ICD-10-CM

## 2013-08-04 LAB — CBC WITH DIFFERENTIAL/PLATELET
Basophils Absolute: 0 10*3/uL (ref 0.0–0.1)
Basophils Relative: 0 % (ref 0–1)
Eosinophils Absolute: 0 10*3/uL (ref 0.0–0.7)
Eosinophils Relative: 0 % (ref 0–5)
MCH: 24.5 pg — ABNORMAL LOW (ref 26.0–34.0)
MCHC: 31.1 g/dL (ref 30.0–36.0)
MCV: 78.8 fL (ref 78.0–100.0)
Neutrophils Relative %: 75 % (ref 43–77)
Platelets: 401 10*3/uL — ABNORMAL HIGH (ref 150–400)
RBC: 4.86 MIL/uL (ref 3.87–5.11)
RDW: 14.6 % (ref 11.5–15.5)

## 2013-08-04 LAB — RAPID URINE DRUG SCREEN, HOSP PERFORMED
Amphetamines: NOT DETECTED
Benzodiazepines: NOT DETECTED
Cocaine: NOT DETECTED
Tetrahydrocannabinol: NOT DETECTED

## 2013-08-04 LAB — COMPREHENSIVE METABOLIC PANEL
ALT: 12 U/L (ref 0–35)
AST: 17 U/L (ref 0–37)
Albumin: 3.8 g/dL (ref 3.5–5.2)
Alkaline Phosphatase: 118 U/L — ABNORMAL HIGH (ref 39–117)
Calcium: 9 mg/dL (ref 8.4–10.5)
Creatinine, Ser: 0.72 mg/dL (ref 0.50–1.10)
GFR calc Af Amer: >90 mL/min (ref >90)
Glucose, Bld: 105 mg/dL — ABNORMAL HIGH (ref 70–99)
Potassium: 2.9 mEq/L — ABNORMAL LOW (ref 3.5–5.1)
Sodium: 139 mEq/L (ref 135–145)
Total Protein: 7.3 g/dL (ref 6.0–8.3)

## 2013-08-04 LAB — ETHANOL: Alcohol, Ethyl (B): <11 mg/dL (ref 0–11)

## 2013-08-04 MED ORDER — TRAZODONE HCL 50 MG PO TABS
50.0000 mg | ORAL_TABLET | Freq: Every evening | ORAL | Status: DC | PRN
  Administered 2013-08-04: 50 mg via ORAL
  Filled 2013-08-04: qty 1

## 2013-08-04 MED ORDER — ACETAMINOPHEN 325 MG PO TABS: 650.0000 mg | ORAL_TABLET | Freq: Four times a day (QID) | ORAL | Status: DC | PRN

## 2013-08-04 MED ORDER — ALUM & MAG HYDROXIDE-SIMETH 200-200-20 MG/5ML PO SUSP: 30.0000 mL | ORAL | Status: DC | PRN

## 2013-08-04 MED ORDER — MAGNESIUM HYDROXIDE 400 MG/5ML PO SUSP: 30.0000 mL | Freq: Every day | ORAL | Status: DC | PRN

## 2013-08-04 MED ORDER — POTASSIUM CHLORIDE CRYS ER 20 MEQ PO TBCR
40.0000 meq | EXTENDED_RELEASE_TABLET | Freq: Once | ORAL | Status: AC
  Administered 2013-08-04: 40 meq via ORAL
  Filled 2013-08-04: qty 2
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
## 2013-08-04 NOTE — ED Notes (Addendum)
Patient reports that she is feeling suicidal and states, "I do not want to discuss my plan." Patient reports that she attempted suicide 8 months ago by taking an overdose of Vicodin. Patient denies any alcohol or drug use, visual or auditory hallucinations, or HI. Patient states she took 10 Tylenol tabs at 1300 today.

## 2013-08-04 NOTE — ED Notes (Signed)
[facility] called. Pt is SI and took a handful of tylenol. Now downplaying event. Pt has bed at [facility] just needs to be medically cleared

## 2013-08-04 NOTE — Progress Notes (Signed)
Voluntary admission for a 42 y.o. Soft spoken female with flat affect and depressed mood.  Pt. Reports being depressed for a month and worsening depression over the weekend.  Stressors-"sole bread winner" and problems at work. Pt. Overdosed on "a hand full of Tylenol".   Pt. Guarded and denies SI/HI and denies A/V hallucinations and contracts for safety.  Prior suicide attempt approximately 8 months ago when she overdosed on Vicodin.   Denies ETOH and drug use.   Reports decreased appetite.   Pt. Offered food but refused offer.   Oriented to unit.  Encouragement and support given.

## 2013-08-04 NOTE — Tx Team (Signed)
Initial Interdisciplinary Treatment Plan  PATIENT STRENGTHS: (choose at least two) Ability for insight Average or above average intelligence Supportive family/friends  PATIENT STRESSORS: Financial difficulties Occupational concerns   PROBLEM LIST: Problem List/Patient Goals Date to be addressed Date deferred Reason deferred Estimated date of resolution  Depression 08/04/13     Goal-be able to return to work 08/04/13                                                DISCHARGE CRITERIA:  Improved stabilization in mood, thinking, and/or behavior Motivation to continue treatment in a less acute level of care Need for constant or close observation no longer present Reduction of life-threatening or endangering symptoms to within safe limits Verbal commitment to aftercare and medication compliance  PRELIMINARY DISCHARGE PLAN: Return to previous living arrangement Return to previous work or school arrangements  PATIENT/FAMIILY INVOLVEMENT: This treatment plan has been presented to and reviewed with the patient, XYLAH EARLY, and/or family member,   The patient and family have been given the opportunity to ask questions and make suggestions.  Bennet Kujawa Dawkins 08/04/2013, 9:57 PM

## 2013-08-04 NOTE — ED Provider Notes (Signed)
CSN: [id number]     Arrival date & time 08/04/13  1630 History  This chart was scribed for non-physician practitioner Arthor Captain, PA-C working with Shon Baton, MD by Valera Castle, ED scribe. This patient was seen in room WTR1/WLPT1 and the patient's care was started at 6:11 PM.    Chief Complaint  Patient presents with  . Medical Clearance    suicidal   The history is provided by the patient. No language interpreter was used.   HPI Comments: Paula Horne is a 42 y.o. female who presents to the Emergency Department for medical clearance. She reports taking about 10 Tylenol to hurt herself today. She denies having tried pills for SI before. She reports going back to [facility]. She is here to make sure her Tylenol levels are normal so she can return. She denies any other complaints. She denies any other associated symptoms.    Past Medical History  Diagnosis Date  . Migraine   . GERD (gastroesophageal reflux disease)   . Sinusitis   . PONV (postoperative nausea and vomiting)   . Depression   . Anemia   . Acid reflux   . Cancer of skin of back     basal cell; [facility]   Past Surgical History  Procedure Laterality Date  . Cholecystectomy  2001  . Esophageal dilation  04/2007    Dr.Kaplan   Family History  Problem Relation Age of Onset  . Depression Father   . Skin cancer Father   . Colon cancer Maternal Uncle   . Depression Mother   . Arthritis Mother     ? type  . Alcohol abuse Brother   . Alcohol abuse Maternal Grandmother   . Stroke Maternal Grandmother   . Alcohol abuse Maternal Grandfather   . Coronary artery disease Paternal Grandmother   . Ovarian cancer Paternal Grandmother    History  Substance Use Topics  . Smoking status: Never Smoker   . Smokeless tobacco: Never Used  . Alcohol Use: No   OB History   Grav Para Term Preterm Abortions TAB SAB Ect Mult Living   6 2 2  4  4   2      Review of Systems  A complete 10 system  review of systems was obtained and all systems are negative except as noted in the HPI and PMH.   Allergies  Sulfonamide derivatives  Home Medications   Current Outpatient Rx  Name  Route  Sig  Dispense  Refill  . CYMBALTA 60 MG capsule      TAKE ONE CAPSULE BY MOUTH ONE TIME DAILY   90 each   0   . diclofenac sodium (VOLTAREN) 1 % GEL   Topical   Apply 2 g topically 2 (two) times daily.   100 g   1   . NEXIUM 40 MG capsule      Take one capsule by mouth one time daily   30 capsule   4   . norethindrone (MICRONOR,CAMILA,ERRIN) 0.35 MG tablet   Oral   Take 1 tablet by mouth daily.         . ziprasidone (GEODON) 60 MG capsule   Oral   Take 60 mg by mouth daily.          Triage Vitals: BP 125/78  Pulse 70  Temp(Src) 97.5 F (36.4 C) (Oral)  Resp 18  Ht 5\' 4"  (1.626 m)  Wt 205 lb (92.987 kg)  BMI 35.17 kg/m2  SpO2 100%  LMP 07/28/2013  Physical Exam  Nursing note and vitals reviewed. Constitutional: She is oriented to person, place, and time. She appears well-developed and well-nourished. No distress.  HENT:  Head: Normocephalic and atraumatic.  Eyes: EOM are normal.  Neck: Neck supple. No tracheal deviation present.  Cardiovascular: Normal rate, regular rhythm and normal heart sounds.  Exam reveals no gallop and no friction rub.   No murmur heard. Pulmonary/Chest: Effort normal and breath sounds normal. No respiratory distress. She has no wheezes. She has no rales.  Musculoskeletal: Normal range of motion.  Neurological: She is alert and oriented to person, place, and time.  Skin: Skin is warm and dry.  Psychiatric: She has a normal mood and affect. Her behavior is normal.    ED Course  Procedures (including critical care time)  DIAGNOSTIC STUDIES: Oxygen Saturation is 100% on room air, normal by my interpretation.    COORDINATION OF CARE: 6:15 PM-Discussed treatment plan which includes EKG, CBC panel, CMP, UA with pt at bedside and pt agreed to  plan.     Labs Review Labs Reviewed  CBC WITH DIFFERENTIAL - Abnormal; Notable for the following:    Hemoglobin 11.9 (*)    MCH 24.5 (*)    Platelets 401 (*)    All other components within normal limits  COMPREHENSIVE METABOLIC PANEL - Abnormal; Notable for the following:    Potassium 2.9 (*)    Glucose, Bld 105 (*)    Alkaline Phosphatase 118 (*)    All other components within normal limits  URINE RAPID DRUG SCREEN (HOSP PERFORMED)  ETHANOL  ACETAMINOPHEN LEVEL   Imaging Review No results found.  EKG Interpretation   None      Scheduled Meds: Continuous Infusions: PRN Meds:.   MDM   1. Intentional acetaminophen overdose, initial encounter     6:16 PM -  Filed Vitals:   08/04/13 1746  BP: 125/78  Pulse: 70  Temp: 97.5 F (36.4 C)  TempSrc: Oral  Resp: 18  Height: 5\' 4"  (1.626 m)  Weight: 205 lb (92.987 kg)  SpO2: 100%   Spoke with nurse Sherrel at [facility]. She recommends levels of Tylenol and LFT. Lab work is currently pending. She states that if history is accurate 10 pills should not be enough to cause severe damage. Pt is asymptomatic at this time.    Patient with slightly elevated ALP.  Hypokalemia repleated with PO potassium 40 mEq Acetaminophen level normal. Patient will be discharged to behavioral health.    I personally performed the services described in this documentation, which was scribed in my presence. The recorded information has been reviewed and is accurate.    Arthor Captain, PA-C 08/04/13 2047

## 2013-08-05 DIAGNOSIS — T391X2A Poisoning by 4-Aminophenol derivatives, intentional self-harm, initial encounter: Secondary | ICD-10-CM

## 2013-08-05 DIAGNOSIS — F411 Generalized anxiety disorder: Secondary | ICD-10-CM

## 2013-08-05 DIAGNOSIS — F332 Major depressive disorder, recurrent severe without psychotic features: Principal | ICD-10-CM | POA: Diagnosis present

## 2013-08-05 MED ORDER — LISDEXAMFETAMINE DIMESYLATE 20 MG PO CAPS
20.0000 mg | ORAL_CAPSULE | Freq: Every day | ORAL | Status: DC
  Administered 2013-08-05 – 2013-08-06 (×2): 20 mg via ORAL
  Filled 2013-08-05 (×2): qty 1

## 2013-08-05 MED ORDER — TRAZODONE HCL 50 MG PO TABS
50.0000 mg | ORAL_TABLET | Freq: Every evening | ORAL | Status: DC | PRN
  Administered 2013-08-05 – 2013-08-06 (×3): 50 mg via ORAL
  Filled 2013-08-05 (×4): qty 1
  Filled 2013-08-05: qty 6
  Filled 2013-08-05: qty 1
  Filled 2013-08-05: qty 6
  Filled 2013-08-05 (×5): qty 1

## 2013-08-05 MED ORDER — PANTOPRAZOLE SODIUM 40 MG PO TBEC
80.0000 mg | DELAYED_RELEASE_TABLET | Freq: Every day | ORAL | Status: DC
  Administered 2013-08-05 – 2013-08-07 (×3): 80 mg via ORAL
  Filled 2013-08-05 (×6): qty 2

## 2013-08-05 MED ORDER — HYDROXYZINE HCL 25 MG PO TABS: 25.0000 mg | ORAL_TABLET | Freq: Four times a day (QID) | ORAL | Status: DC | PRN

## 2013-08-05 MED ORDER — ADULT MULTIVITAMIN W/MINERALS CH
1.0000 | ORAL_TABLET | Freq: Every day | ORAL | Status: DC
  Administered 2013-08-05 – 2013-08-08 (×4): 1 via ORAL
  Filled 2013-08-05 (×6): qty 1

## 2013-08-05 MED ORDER — DULOXETINE HCL 60 MG PO CPEP
60.0000 mg | ORAL_CAPSULE | Freq: Every day | ORAL | Status: DC
  Administered 2013-08-05 – 2013-08-08 (×4): 60 mg via ORAL
  Filled 2013-08-05: qty 3
  Filled 2013-08-05 (×6): qty 1

## 2013-08-05 NOTE — Progress Notes (Signed)
Nutrition Brief Note  Pt meets criteria for severe MALNUTRITION in the context of chronic illness as evidenced by <75% estimated energy intake with 7% weight loss in the past month.  Patient identified on the Malnutrition Screening Tool (MST) Report.  Wt Readings from Last 10 Encounters:  08/04/13 199 lb (90.266 kg)  08/04/13 205 lb (92.987 kg)  07/13/13 214 lb (97.07 kg)  01/02/13 214 lb 6.4 oz (97.251 kg)  11/08/12 221 lb (100.245 kg)  08/25/12 229 lb (103.874 kg)  05/01/12 229 lb 12.8 oz (104.237 kg)  01/03/12 223 lb (101.152 kg)  10/12/11 218 lb 9.6 oz (99.156 kg)  06/26/11 246 lb (111.585 kg)   Body mass index is 35.26 kg/(m^2). Patient meets criteria for class II obesity based on current BMI.   Discussed intake PTA with patient and compared to intake presently.  Discussed changes in intake and encouraged adequate intake of meals and snacks. Current diet order is regular and pt is also offered choice of unit snacks mid-morning and mid-afternoon.  Pt is eating as desired.    Met with pt who reports poor appetite for at least the past month with pt consuming only 1 meal/day. Has lost 15 pounds unintentionally in the past month. Does not want to drink any nutritional supplements. States she is a picky eater. Discussed alternate food options cafeteria can provide if she does not like the food served.   Labs and medications reviewed. Alk phos elevated, potassium low.   Nutrition Dx:  Unintended wt change r/t suboptimal oral intake AEB pt report  Interventions:   Discussed the importance of nutrition and encouraged intake of food and beverages.     Encouraged pt to eat at least 50% of meals during admission    Multivitamin 1 tablet PO daily  No additional nutrition interventions warranted at this time. If nutrition issues arise, please consult RD.   Levon Hedger MS, RD, LDN [phone] Pager [phone] After Hours Pager

## 2013-08-05 NOTE — Progress Notes (Signed)
Patient ID: PAMELYN BANCROFT, female   DOB: 02-10-1971, 42 y.o.   MRN: [id number]  D: Pt denies SI/HI/AVH. Pt is pleasant and cooperative. Pt stated she had an even type of day. Pt seems to remain flat, but has developed a relationship with her new roommate.   A: Pt was offered support and encouragement. Pt was given scheduled medications. Pt was encourage to attend groups. Q 15 minute checks were done for safety.   R:Pt attends groups and interacts well with peers and staff. Pt is taking medication. Pt has no complaints at this time.Pt receptive to treatment and safety maintained on unit.

## 2013-08-05 NOTE — Progress Notes (Signed)
Recreation Therapy Notes  Date: 10.21.2014 Time: 2:30pm Location: [address] Dayroom  Group Topic: Software engineer Activities (AAA)  Behavioral Response: Did not attend.   Marykay Lex Cyara Devoto, LRT/CTRS  Jearl Klinefelter 08/05/2013 4:29 PM

## 2013-08-05 NOTE — ED Provider Notes (Signed)
Medical screening examination/treatment/procedure(s) were performed by non-physician practitioner and as supervising physician I was immediately available for consultation/collaboration.  Courtney F Horton, MD 08/05/13 0955 

## 2013-08-05 NOTE — [facility] Suicide Risk Assessment (Signed)
Suicide Risk Assessment  Admission Assessment     Nursing information obtained from:  Patient Demographic factors:  Caucasian Current Mental Status:   (denies) Loss Factors:  Financial problems / change in socioeconomic status Historical Factors:  Prior suicide attempts Risk Reduction Factors:  Responsible for children under 42 years of age;Employed;Living with another person, especially a relative  CLINICAL FACTORS:   Severe Anxiety and/or Agitation Depression:   Anhedonia Hopelessness Impulsivity Insomnia Recent sense of peace/wellbeing Severe Previous Psychiatric Diagnoses and Treatments Medical Diagnoses and Treatments/Surgeries  COGNITIVE FEATURES THAT CONTRIBUTE TO RISK:  Closed-mindedness Loss of executive function Polarized thinking Thought constriction (tunnel vision)    SUICIDE RISK:   Moderate:  Frequent suicidal ideation with limited intensity, and duration, some specificity in terms of plans, no associated intent, good self-control, limited dysphoria/symptomatology, some risk factors present, and identifiable protective factors, including available and accessible social support.  PLAN OF CARE:Patient needs acute inpatient psych treatment for crisis stabilization and safety monitoring.   I certify that inpatient services furnished can reasonably be expected to improve the patient's condition.  Paula Horne,[name] 08/05/2013, 11:56 AM
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
## 2013-08-05 NOTE — Progress Notes (Signed)
The focus of this group is to educate the patient on the purpose and policies of crisis stabilization and provide a format to answer questions about their admission.  The group details unit policies and expectations of patients while admitted.  Patient attended 0900 nurse education orientation group this morning.  Patient actively participated, appropriate affect, alert, appropriate insight and engagement.  Today patient will work on 3 goals for discharge.  
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
## 2013-08-05 NOTE — [facility] Group Notes (Signed)
[facility] LCSW Group Therapy      Feelings About Diagnosis 1:15 - 2:30 PM         08/05/2013  3:08 PM    Type of Therapy:  Group Therapy  Participation Level:  Active  Participation Quality:  Appropriate  Affect:  Appropriate  Cognitive:  Alert and Appropriate  Insight:  Developing/Improving and Engaged  Engagement in Therapy:  Developing/Improving and Engaged  Modes of Intervention:  Discussion, Education, Exploration, Problem-Solving, Rapport Building, Support  Summary of Progress/Problems:  Patient actively participated in group. Patient discussed past and present diagnosis and the effects it has had on  life.  Patient talked about family and society being judgmental and the stigma associated with having a mental health diagnosis.  Patient shared her family has a long history of depression.  She stated she is indifferent to the diagnosis.  Wynn Banker 08/05/2013  3:08 PM

## 2013-08-05 NOTE — [facility] Suicide Risk Assessment (Signed)
[facility] INPATIENT:  Family/Significant Other Suicide Prevention Education  Suicide Prevention Education:  Education Completed; Paula Horne, Husband, [phone]; has been identified by the patient as the family member/significant other with whom the patient will be residing, and identified as the person(s) who will aid the patient in the event of a mental health crisis (suicidal ideations/suicide attempt).  With written consent from the patient, the family member/significant other has been provided the following suicide prevention education, prior to the and/or following the discharge of the patient.  The suicide prevention education provided includes the following:  Suicide risk factors  Suicide prevention and interventions  National Suicide Hotline telephone number  [facility] assessment telephone number  [facility] Emergency Assistance 911  [facility] and/or Residential Mobile Crisis Unit telephone number  Request made of family/significant other to:  Remove weapons (e.g., guns, rifles, knives), all items previously/currently identified as safety concern.  Husband advised he will secure guns prior to patient discharging.     Remove drugs/medications (over-the-counter, prescriptions, illicit drugs), all items previously/currently identified as a safety concern.  The family member/significant other verbalizes understanding of the suicide prevention education information provided.  The family member/significant other agrees to remove the items of safety concern listed above.  Paula Horne 08/05/2013, 11:30 AM

## 2013-08-06 LAB — COMPREHENSIVE METABOLIC PANEL
Albumin: 3.8 g/dL (ref 3.5–5.2)
Alkaline Phosphatase: 122 U/L — ABNORMAL HIGH (ref 39–117)
BUN: 5 mg/dL — ABNORMAL LOW (ref 6–23)
Calcium: 9.3 mg/dL (ref 8.4–10.5)
Chloride: 101 mEq/L (ref 96–112)
Creatinine, Ser: 0.75 mg/dL (ref 0.50–1.10)
GFR calc non Af Amer: >90 mL/min (ref >90)
Glucose, Bld: 98 mg/dL (ref 70–99)
Total Bilirubin: 0.4 mg/dL (ref 0.3–1.2)

## 2013-08-06 MED ORDER — TOPIRAMATE 100 MG PO TABS
100.0000 mg | ORAL_TABLET | Freq: Every day | ORAL | Status: DC
  Administered 2013-08-07 – 2013-08-08 (×2): 100 mg via ORAL
  Filled 2013-08-06: qty 1
  Filled 2013-08-06: qty 3
  Filled 2013-08-06 (×2): qty 1

## 2013-08-06 MED ORDER — ELETRIPTAN HYDROBROMIDE 20 MG PO TABS
20.0000 mg | ORAL_TABLET | ORAL | Status: DC | PRN
  Administered 2013-08-06 – 2013-08-07 (×3): 20 mg via ORAL
  Filled 2013-08-06 (×3): qty 1

## 2013-08-06 MED ORDER — LISDEXAMFETAMINE DIMESYLATE 30 MG PO CAPS
30.0000 mg | ORAL_CAPSULE | Freq: Every day | ORAL | Status: DC
  Administered 2013-08-07 – 2013-08-08 (×2): 30 mg via ORAL
  Filled 2013-08-06 (×2): qty 1

## 2013-08-06 MED ORDER — ONDANSETRON 4 MG PO TBDP
4.0000 mg | ORAL_TABLET | Freq: Three times a day (TID) | ORAL | Status: DC | PRN
Start: 2013-08-06 — End: 2013-08-08
  Administered 2013-08-06: 4 mg via ORAL
  Filled 2013-08-06: qty 1

## 2013-08-06 NOTE — Progress Notes (Signed)
Adult Psychoeducational Group Note  Date:  08/06/2013 Time:  9:22 PM  Group Topic/Focus:  Wrap-Up Group:   The focus of this group is to help patients review their daily goal of treatment and discuss progress on daily workbooks.   Participation Level:  Active  Participation Quality:  Appropriate  Affect:  Appropriate  Cognitive:  Appropriate  Insight: Appropriate  Engagement in Group:  Engaged  Modes of Intervention:  Support  Additional Comments:  Pt stated that she was sick today but that one good thing that happened was that she is feeling better and that she only missed one group. Stated that she learned about her emotions and what sets her off.   Paula Horne 08/06/2013, 9:22 PM

## 2013-08-06 NOTE — Tx Team (Signed)
Interdisciplinary Treatment Plan Update   Date Reviewed:  08/06/2013  Time Reviewed:  9:52 AM  Progress in Treatment:   Attending groups: Yes Participating in groups: Yes Taking medication as prescribed: Yes  Tolerating medication: Yes Family/Significant other contact made: Yes , contact made with husband Patient understands diagnosis: Yes  Discussing patient identified problems/goals with staff: Yes Medical problems stabilized or resolved: Yes Denies suicidal/homicidal ideation: Yes Patient has not harmed self or others: Yes  For review of initial/current patient goals, please see plan of care.  Estimated Length of Stay:  2-3 days  Reasons for Continued Hospitalization:  Anxiety Depression Medication stabilization Suicidal ideation  New Problems/Goals identified:    Discharge Plan or Barriers:   Home with outpatient follow up with Dr. Toni Arthurs and [facility]  Additional Comments: N/A  Attendees:  Patient:  08/06/2013 9:52 AM   Signature: Mervyn Gay, MD 08/06/2013 9:52 AM  Signature:   08/06/2013 9:52 AM  Signature: 08/06/2013 9:52 AM  Signature:Donna Shimp, RN 08/06/2013 9:52 AM  Signature:   08/06/2013 9:52 AM  Signature:  Juline Patch, LCSW 08/06/2013 9:52 AM  Signature:  Reyes Ivan, LCSW 08/06/2013 9:52 AM  Signature:  Maseta Dorley,Care Coordinator 08/06/2013 9:52 AM  Signature:   08/06/2013 9:52 AM  Signature:  08/06/2013  9:52 AM  Signature:    08/06/2013  9:52 AM  Signature:  Elliot Cousin, RN 08/06/2013  9:52 AM    Scribe for Treatment Team:   Juline Patch,  08/06/2013 9:52 AM

## 2013-08-06 NOTE — Progress Notes (Signed)
[facility] MD Progress Note  08/06/2013 2:28 PM Paula Horne  MRN:  [id number] Subjective: Patient complaint migraine headache and unable to participate in group therapeutic activities. Patient stated she felt better energy to her after taking medication yesterday. Patient has depression anxiety and worry about her family. Patient continued to have suicidal ideation but contracts for safety. Diagnosis:   DSM5: Schizophrenia Disorders:   Obsessive-Compulsive Disorders:   Trauma-Stressor Disorders:   Substance/Addictive Disorders:   Depressive Disorders:  Major Depressive Disorder - Severe (296.23)  Axis I: Major Depression, Recurrent severe  ADL's:  Impaired  Sleep: Fair  Appetite:  Fair  Suicidal Ideation:  Suicidal ideation present but contracts for safety Homicidal Ideation:  None AEB (as evidenced by):  Psychiatric Specialty Exam: ROS  Blood pressure 112/78, pulse 78, temperature 97.9 F (36.6 C), temperature source Oral, resp. rate 20, height 5\' 3"  (1.6 m), weight 90.266 kg (199 lb), last menstrual period 07/28/2013.Body mass index is 35.26 kg/(m^2).  General Appearance: Disheveled and Guarded  Eye Solicitor::  Fair  Speech:  Clear and Coherent and Slow  Volume:  Normal  Mood:  Anxious, Depressed, Hopeless and Worthless  Affect:  Depressed and Flat  Thought Process:  Goal Directed and Intact  Orientation:  Full (Time, Place, and Person)  Thought Content:  Rumination  Suicidal Thoughts:  Yes.  without intent/plan  Homicidal Thoughts:  No  Memory:  Immediate;   Fair  Judgement:  Impaired  Insight:  Lacking  Psychomotor Activity:  Psychomotor Retardation  Concentration:  Fair  Recall:  Fair  Akathisia:  NA  Handed:  Right  AIMS (if indicated):     Assets:  Communication Skills Desire for Improvement Financial Resources/Insurance Housing Intimacy Physical Health Resilience Social Support Transportation Vocational/Educational  Sleep:  Number of Hours: 6    Current Medications: Current Facility-Administered Medications  Medication Dose Route Frequency Provider Last Rate Last Dose  . acetaminophen (TYLENOL) tablet 650 mg  650 mg Oral Q6H PRN Fransisca Kaufmann, NP      . alum & mag hydroxide-simeth (MAALOX/MYLANTA) 200-200-20 MG/5ML suspension 30 mL  30 mL Oral Q4H PRN Fransisca Kaufmann, NP      . DULoxetine (CYMBALTA) DR capsule 60 mg  60 mg Oral Daily Nehemiah Settle, MD   60 mg at 08/06/13 0825  . eletriptan (RELPAX) tablet 20 mg  20 mg Oral Q2H PRN Nehemiah Settle, MD      . hydrOXYzine (ATARAX/VISTARIL) tablet 25 mg  25 mg Oral Q6H PRN Nehemiah Settle, MD      . Melene Muller ON 08/07/2013] lisdexamfetamine (VYVANSE) capsule 30 mg  30 mg Oral Daily Nehemiah Settle, MD      . magnesium hydroxide (MILK OF MAGNESIA) suspension 30 mL  30 mL Oral Daily PRN Fransisca Kaufmann, NP      . multivitamin with minerals tablet 1 tablet  1 tablet Oral Daily Lavena Bullion, RD   1 tablet at 08/06/13 [phone]  . pantoprazole (PROTONIX) EC tablet 80 mg  80 mg Oral Q1200 Nehemiah Settle, MD   80 mg at 08/06/13 1137  . [START ON 08/07/2013] topiramate (TOPAMAX) tablet 100 mg  100 mg Oral Daily Nehemiah Settle, MD      . traZODone (DESYREL) tablet 50 mg  50 mg Oral QHS,MR X 1 Kerry Hough, PA-C   50 mg at 08/06/13 5284    Lab Results:  Results for orders placed during the hospital encounter of 08/04/13 (from the past 48 hour(s))  COMPREHENSIVE METABOLIC PANEL     Status: Abnormal   Collection Time    08/06/13  6:30 AM      Result Value Range   Sodium 138  135 - 145 mEq/L   Potassium 2.9 (*) 3.5 - 5.1 mEq/L   Chloride 101  96 - 112 mEq/L   CO2 24  19 - 32 mEq/L   Glucose, Bld 98  70 - 99 mg/dL   BUN 5 (*) 6 - 23 mg/dL   Creatinine, Ser 1.61  0.50 - 1.10 mg/dL   Calcium 9.3  8.4 - 09.6 mg/dL   Total Protein 7.4  6.0 - 8.3 g/dL   Albumin 3.8  3.5 - 5.2 g/dL   AST 15  0 - 37 U/L   ALT 11  0 - 35 U/L   Alkaline  Phosphatase 122 (*) 39 - 117 U/L   Total Bilirubin 0.4  0.3 - 1.2 mg/dL   GFR calc non Af Amer >90  >90 mL/min   GFR calc Af Amer >90  >90 mL/min   Comment: (NOTE)     The eGFR has been calculated using the CKD EPI equation.     This calculation has not been validated in all clinical situations.     eGFR's persistently <90 mL/min signify possible Chronic Kidney     Disease.     Performed at [facility]    Physical Findings: AIMS: Facial and Oral Movements Muscles of Facial Expression: None, normal Lips and Perioral Area: None, normal Jaw: None, normal Tongue: None, normal,Extremity Movements Upper (arms, wrists, hands, fingers): None, normal Lower (legs, knees, ankles, toes): None, normal, Trunk Movements Neck, shoulders, hips: None, normal, Overall Severity Severity of abnormal movements (highest score from questions above): None, normal Incapacitation due to abnormal movements: None, normal Patient's awareness of abnormal movements (rate only patient's report): No Awareness, Dental Status Current problems with teeth and/or dentures?: No Does patient usually wear dentures?: No  CIWA:    COWS:     Treatment Plan Summary: Daily contact with patient to assess and evaluate symptoms and progress in treatment Medication management  Plan: Relpax 20 mg Q 2 HOURS as needed for migraine headache Topamax 100 mg daily as a migraine prophylaxis starting tomorrow Increase Vyvanse 30 mg starting tomorrow Continue Cymbalta 60 mg daily for depression Treatment Plan/Recommendations:   1. Admit for crisis management and stabilization. 2. Medication management to reduce current symptoms to base line and improve the patient's overall level of functioning. 3. Treat health problems as indicated. 4. Develop treatment plan to decrease risk of relapse upon discharge and to reduce the need for readmission. 5. Psycho-social education regarding relapse prevention and self care. 6.  Health care follow up as needed for medical problems. 7. Restart home medications where appropriate.    Medical Decision Making Problem Points:  Established problem, worsening (2), Review of last therapy session (1) and Review of psycho-social stressors (1) Data Points:  Review or order clinical lab tests (1) Review or order medicine tests (1) Review of medication regiment & side effects (2) Review of new medications or change in dosage (2)  I certify that inpatient services furnished can reasonably be expected to improve the patient's condition.   Daisean Brodhead,[name] 08/06/2013, 2:28 PM

## 2013-08-06 NOTE — Progress Notes (Signed)
Patient ID: Paula Horne, female   DOB: 20-Jun-1971, 42 y.o.   MRN: [id number] She  Has been up and to part of the groups has minimal interaction with peers.  Self inventory: depression 3, hopelessness 3 denies SI thoughts.  She plans to go home and then fine her a new job because the one she has now causes to much stress

## 2013-08-06 NOTE — Progress Notes (Signed)
Adult Psychoeducational Group Note  Date:  08/06/2013 Time:  11:00am Group Topic/Focus:  Personal Choices and Values:   The focus of this group is to help patients assess and explore the importance of values in their lives, how their values affect their decisions, how they express their values and what opposes their expression.  Participation Level:  Active  Participation Quality:  Appropriate and Attentive  Affect:  Appropriate  Cognitive:  Alert and Appropriate  Insight: Appropriate  Engagement in Group:  Engaged  Modes of Intervention:  Discussion and Education  Additional Comments:  Pt attended and participated in group with her interpreter. Discussion today is about personal development. Pt completed a worksheet on automatic thoughts. Pt had give examples of a trigger what the automatic thought was and what their new thought would be. Pt stated an unexpected bill arrived. Automatic thought is How are we going to pay for it and Im the only one working. New thought is the money will probably be there.  Shelly Bombard D 08/06/2013, 11:52 AM

## 2013-08-06 NOTE — [facility] Group Notes (Signed)
[facility] LCSW Group Therapy  Emotional Regulation 1:15 - 2: 30 PM        08/06/2013   4:01 PM   Type of Therapy:  Group Therapy  Participation Level:  Appropriate  Participation Quality:  Appropriate  Affect:  Appropriate  Cognitive:  Attentive Appropriate  Insight:  Engaged  Engagement in Therapy:  Engaged  Modes of Intervention:  Discussion Exploration Problem-Solving Supportive  Summary of Progress/Problems:  Group topic was emotional regulations.  Patient shared she had a migraine headache and did not feel like participating in the discussion.  Patient was called out of group to meet with MD.  Juline Patch Hairston 08/06/2013 4:01 PM

## 2013-08-07 NOTE — Progress Notes (Addendum)
Recreation Therapy Notes  Date: 10.22.2014 Time: 3:00pm Location: [address] Dayroom  Group Topic: Self-Esteem  Goal Area(s) Addresses:  Patient will effectively give examples of way to increase self-esteem. Patient will verbalize benefit of increased self-esteem.   Behavioral Response: Did not attend  Jearl Klinefelter, LRT/CTRS  Jla Reynolds L 08/07/2013 8:25 AM

## 2013-08-07 NOTE — Progress Notes (Signed)
Chaplain provided brief support with pt while rounding on [address].  Pt detailed split in relationship with husband, relating that she feels more at peace with their separation.

## 2013-08-07 NOTE — Progress Notes (Signed)
D:  Patient's self inventory sheet, patient has fair sleep, improving appetite, normal energy level, good attention span.  Rated depression and hopeless 1.  Denied anxiety.  Denied SI.  Denied pain.   After discharge, leave as much work at work.  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  No problems taking meds after discharge. A:  Medications administered per MD orders.  Emotional support and encouragement given patient. R:  Denied SI and HI.  Denied A/V hallucinations.  Denied pain.  Feels she is ready for discharge.  Knows she has follow up appointments on Monday.  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

## 2013-08-07 NOTE — Progress Notes (Signed)
[facility] MD Progress Note  08/07/2013 2:32 PM Paula Horne  MRN:  [id number]  Subjective: Patient has expressed his satisfaction with her current medications regimen and controlled symptoms of migraine headache. Patient reported she has been less depressed and more and it did take unable to participate in group therapeutic activities. Patient stated she has slept well and eating much better. Patient is concerned about helping her children to participate in halloween activity during this weekend. Patient has been regular communication with her husband and her husband has been bringing her children for the hospital visits. Patient reported her husband is supportive to her and also stated looking for a less stressful job in near future. Patient denies current suicidal ideation and contracts for safety. Diagnosis:   DSM5: Schizophrenia Disorders:   Obsessive-Compulsive Disorders:   Trauma-Stressor Disorders:   Substance/Addictive Disorders:   Depressive Disorders:  Major Depressive Disorder - Severe (296.23)  Axis I: Major Depression, Recurrent severe  ADL's:  Impaired  Sleep: Fair  Appetite:  Fair  Suicidal Ideation:  Suicidal ideation present but contracts for safety Homicidal Ideation:  None AEB (as evidenced by):  Psychiatric Specialty Exam: ROS  Blood pressure 109/73, pulse 98, temperature 98.3 F (36.8 C), temperature source Oral, resp. rate 16, height 5\' 3"  (1.6 m), weight 90.266 kg (199 lb), last menstrual period 07/28/2013.Body mass index is 35.26 kg/(m^2).  General Appearance: Disheveled and Guarded  Eye Solicitor::  Fair  Speech:  Clear and Coherent and Slow  Volume:  Normal  Mood:  Anxious, Depressed, Hopeless and Worthless  Affect:  Depressed and Flat  Thought Process:  Goal Directed and Intact  Orientation:  Full (Time, Place, and Person)  Thought Content:  Rumination  Suicidal Thoughts:  Yes.  without intent/plan  Homicidal Thoughts:  No  Memory:  Immediate;   Fair   Judgement:  Impaired  Insight:  Lacking  Psychomotor Activity:  Psychomotor Retardation  Concentration:  Fair  Recall:  Fair  Akathisia:  NA  Handed:  Right  AIMS (if indicated):     Assets:  Communication Skills Desire for Improvement Financial Resources/Insurance Housing Intimacy Physical Health Resilience Social Support Transportation Vocational/Educational  Sleep:  Number of Hours: 6   Current Medications: Current Facility-Administered Medications  Medication Dose Route Frequency Provider Last Rate Last Dose  . acetaminophen (TYLENOL) tablet 650 mg  650 mg Oral Q6H PRN Fransisca Kaufmann, NP      . alum & mag hydroxide-simeth (MAALOX/MYLANTA) 200-200-20 MG/5ML suspension 30 mL  30 mL Oral Q4H PRN Fransisca Kaufmann, NP      . DULoxetine (CYMBALTA) DR capsule 60 mg  60 mg Oral Daily Nehemiah Settle, MD   60 mg at 08/07/13 0748  . eletriptan (RELPAX) tablet 20 mg  20 mg Oral Q2H PRN Nehemiah Settle, MD   20 mg at 08/06/13 1740  . hydrOXYzine (ATARAX/VISTARIL) tablet 25 mg  25 mg Oral Q6H PRN Nehemiah Settle, MD      . lisdexamfetamine (VYVANSE) capsule 30 mg  30 mg Oral Daily Nehemiah Settle, MD   30 mg at 08/07/13 0748  . magnesium hydroxide (MILK OF MAGNESIA) suspension 30 mL  30 mL Oral Daily PRN Fransisca Kaufmann, NP      . multivitamin with minerals tablet 1 tablet  1 tablet Oral Daily Lavena Bullion, RD   1 tablet at 08/07/13 0748  . ondansetron (ZOFRAN-ODT) disintegrating tablet 4 mg  4 mg Oral Q8H PRN Fransisca Kaufmann, NP   4 mg at 08/06/13  1632  . pantoprazole (PROTONIX) EC tablet 80 mg  80 mg Oral Q1200 Nehemiah Settle, MD   80 mg at 08/07/13 1140  . topiramate (TOPAMAX) tablet 100 mg  100 mg Oral Daily Nehemiah Settle, MD   100 mg at 08/07/13 0749  . traZODone (DESYREL) tablet 50 mg  50 mg Oral QHS,MR X 1 Kerry Hough, PA-C   50 mg at 08/06/13 2200    Lab Results:  Results for orders placed during the hospital encounter of  08/04/13 (from the past 48 hour(s))  COMPREHENSIVE METABOLIC PANEL     Status: Abnormal   Collection Time    08/06/13  6:30 AM      Result Value Range   Sodium 138  135 - 145 mEq/L   Potassium 2.9 (*) 3.5 - 5.1 mEq/L   Chloride 101  96 - 112 mEq/L   CO2 24  19 - 32 mEq/L   Glucose, Bld 98  70 - 99 mg/dL   BUN 5 (*) 6 - 23 mg/dL   Creatinine, Ser 1.61  0.50 - 1.10 mg/dL   Calcium 9.3  8.4 - 09.6 mg/dL   Total Protein 7.4  6.0 - 8.3 g/dL   Albumin 3.8  3.5 - 5.2 g/dL   AST 15  0 - 37 U/L   ALT 11  0 - 35 U/L   Alkaline Phosphatase 122 (*) 39 - 117 U/L   Total Bilirubin 0.4  0.3 - 1.2 mg/dL   GFR calc non Af Amer >90  >90 mL/min   GFR calc Af Amer >90  >90 mL/min   Comment: (NOTE)     The eGFR has been calculated using the CKD EPI equation.     This calculation has not been validated in all clinical situations.     eGFR's persistently <90 mL/min signify possible Chronic Kidney     Disease.     Performed at [facility]    Physical Findings: AIMS: Facial and Oral Movements Muscles of Facial Expression: None, normal Lips and Perioral Area: None, normal Jaw: None, normal Tongue: None, normal,Extremity Movements Upper (arms, wrists, hands, fingers): None, normal Lower (legs, knees, ankles, toes): None, normal, Trunk Movements Neck, shoulders, hips: None, normal, Overall Severity Severity of abnormal movements (highest score from questions above): None, normal Incapacitation due to abnormal movements: None, normal Patient's awareness of abnormal movements (rate only patient's report): No Awareness, Dental Status Current problems with teeth and/or dentures?: No Does patient usually wear dentures?: No  CIWA:    COWS:     Treatment Plan Summary: Daily contact with patient to assess and evaluate symptoms and progress in treatment Medication management  Plan: Continue Relpax 20 mg Q 2 HOURS as needed for migraine headache Continue Topamax 100 mg daily as a  migraine prophylaxis starting tomorrow Continue Vyvanse 30 mg starting tomorrow Continue Cymbalta 60 mg daily for depression Treatment Plan/Recommendations:   1. Admit for crisis management and stabilization. 2. Medication management to reduce current symptoms to base line and improve the patient's overall level of functioning. 3. Treat health problems as indicated. 4. Develop treatment plan to decrease risk of relapse upon discharge and to reduce the need for readmission. 5. Psycho-social education regarding relapse prevention and self care. 6. Health care follow up as needed for medical problems. 7. Restart home medications where appropriate. 8. Disposition plans are in progress, may be discharged tomorrow if she continue contract for safety and show improvement clinically.    Medical  Decision Making Problem Points:  Established problem, worsening (2), Review of last therapy session (1) and Review of psycho-social stressors (1) Data Points:  Review or order clinical lab tests (1) Review or order medicine tests (1) Review of medication regiment & side effects (2) Review of new medications or change in dosage (2)  I certify that inpatient services furnished can reasonably be expected to improve the patient's condition.   Nehemiah Settle., M.D. 08/07/2013, 2:32 PM

## 2013-08-07 NOTE — [facility] Group Notes (Signed)
[facility] LCSW Group Therapy  08/07/2013  1:15 PM   Type of Therapy:  Group Therapy  Participation Level:  Active  Participation Quality:  Appropriate and Attentive  Affect:  Appropriate, Flat and Depressed  Cognitive:  Alert and Appropriate  Insight:  Developing/Improving and Engaged  Engagement in Therapy:  Developing/Improving and Engaged  Modes of Intervention:  Clarification, Confrontation, Discussion, Education, Exploration, Limit-setting, Orientation, Problem-solving, Rapport Building, Dance movement psychotherapist, Socialization and Support  Summary of Progress/Problems: The topic for group was balance in life.  Today's group focused on defining balance in one's own words, identifying things that can knock one off balance, and exploring healthy ways to maintain balance in life. Group members were asked to provide an example of a time when they felt off balance, describe how they handled that situation,and process healthier ways to regain balance in the future. Group members were asked to share the most important tool for maintaining balance that they learned while at [facility] and how they plan to apply this method after discharge.  Pt shared that she needs to restore balance in her life by setting limits with her work and not bring so much home, to spend more time with her family.  Pt discussed setting realistic goals to start working on this.  Pt actively participated and was engaged in group discussion.    Reyes Ivan, LCSW 08/07/2013 2:30 PM

## 2013-08-07 NOTE — Progress Notes (Signed)
Patient ID: Paula Horne, female   DOB: 02-Dec-1970, 41 y.o.   MRN: [id number]   D: Pt stated she feels better now that her husband has agreed to look for employment. Stated she's spoke with him several times in the past about getting a job. Pt stated that even if she doesn't find a job, she'll feel better knowing that at least she's attempting.  Stated that she also plans to go to the career ctr at [employer] to see the other avenues she can use with her masters degree.  Pt voiced no other concerns.  A:  Support and encouragement was offered. 15 min checks continued for safety.  R: Pt remains safe.

## 2013-08-07 NOTE — Progress Notes (Signed)
The focus of this group is to educate the patient on the purpose and policies of crisis stabilization and provide a format to answer questions about their admission.  The group details unit policies and expectations of patients while admitted.  Patient attended 0900 nurse education orientation group this morning.  Patient actively participated, appropriate affect, alert, appropriate insight and engagement.  Today patient will work on 3 goals for discharge.  

## 2013-08-07 NOTE — Progress Notes (Signed)
Adult Psychoeducational Group Note  Date:  08/07/2013 Time:  1:53 PM  Group Topic/Focus:  Building Self Esteem:   The Focus of this group is helping patients become aware of the effects of self-esteem on their lives, the things they and others do that enhance or undermine their self-esteem, seeing the relationship between their level of self-esteem and the choices they make and learning ways to enhance self-esteem.  Participation Level:  Active  Participation Quality:  Appropriate and Attentive  Affect:  Appropriate  Cognitive:  Alert and Appropriate  Insight: Good  Engagement in Group:  Engaged  Modes of Intervention:  Activity, Discussion, Exploration, Socialization and Support  Additional Comments:  Pt came to group and shared that stress and depression with isolation are negatively effecting her self-esteem. Pt plans on changing this by spending time with her children and finding a hobby. Pt also plans on attending therapy.   Cathlean Cower 08/07/2013, 1:53 PM

## 2013-08-08 ENCOUNTER — Telehealth: Payer: Self-pay

## 2013-08-08 MED ORDER — TOPIRAMATE 100 MG PO TABS: 100.0000 mg | ORAL_TABLET | Freq: Every day | ORAL | Status: DC

## 2013-08-08 MED ORDER — LISDEXAMFETAMINE DIMESYLATE 30 MG PO CAPS: 30.0000 mg | ORAL_CAPSULE | Freq: Every day | ORAL | Status: DC

## 2013-08-08 MED ORDER — TRAZODONE HCL 50 MG PO TABS: ORAL_TABLET | ORAL | Status: DC

## 2013-08-08 MED ORDER — ESOMEPRAZOLE MAGNESIUM 40 MG PO CPDR: 40.0000 mg | DELAYED_RELEASE_CAPSULE | Freq: Every day | ORAL | Status: DC

## 2013-08-08 MED ORDER — ELETRIPTAN HYDROBROMIDE 20 MG PO TABS: 20.0000 mg | ORAL_TABLET | ORAL | Status: DC | PRN

## 2013-08-08 MED ORDER — TRAZODONE HCL 50 MG PO TABS
50.0000 mg | ORAL_TABLET | Freq: Every evening | ORAL | Status: DC | PRN
  Filled 2013-08-08: qty 3

## 2013-08-08 MED ORDER — DULOXETINE HCL 60 MG PO CPEP: 60.0000 mg | ORAL_CAPSULE | Freq: Every day | ORAL | Status: DC

## 2013-08-08 NOTE — Tx Team (Signed)
Interdisciplinary Treatment Plan Update (Adult)  Date: 08/08/2013  Time Reviewed:  9:45 AM  Progress in Treatment: Attending groups: Yes Participating in groups:  Yes Taking medication as prescribed:  Yes Tolerating medication:  Yes Family/Significant othe contact made: Yes Patient understands diagnosis:  Yes Discussing patient identified problems/goals with staff:  Yes Medical problems stabilized or resolved:  Yes Denies suicidal/homicidal ideation: Yes Issues/concerns per patient self-inventory:  Yes Other:  New problem(s) identified: N/A  Discharge Plan or Barriers: Pt has follow up scheduled for medication management and therapy.   Reason for Continuation of Hospitalization: Stable to d/c today  Comments: N/A  Estimated length of stay: D/C today  For review of initial/current patient goals, please see plan of care.  Attendees: Patient:  Paula Horne  08/08/2013 10:24 AM   Family:     Physician:  Dr. Javier Glazier 08/08/2013 10:24 AM   Nursing:   Dellia Cloud, RN 08/08/2013 10:24 AM   Clinical Social Worker:  Reyes Ivan, LCSW 08/08/2013 10:24 AM   Other: Verne Spurr, PA 08/08/2013 10:24 AM   Other:  Frankey Shown, MA care coordination 08/08/2013 10:24 AM   Other:  Onnie Boer, case manager 08/08/2013 10:24 AM   Other:     Other:    Other:    Other:    Other:    Other:      Scribe for Treatment Team:   Carmina Miller, 08/08/2013 , 10:24 AM

## 2013-08-08 NOTE — Discharge Summary (Signed)
Physician Discharge Summary Note  Patient:  Paula Horne is an 42 y.o., female MRN:  [id number] DOB:  Mar 12, 1971 Patient phone:  [phone] (home)  Patient address:   [address] [place] [facility] [address],   Date of Admission:  08/04/2013 Date of Discharge: 08/08/2013   Reason for Admission:  Suicide attempt  Discharge Diagnoses: Principal Problem:   Suicide attempt by acetaminophen overdose Active Problems:   Major depressive disorder, recurrent episode, severe, without mention of psychotic behavior  ROS   DSM5:  Schizophrenia Disorders:  Obsessive-Compulsive Disorders:  Trauma-Stressor Disorders:  Substance/Addictive Disorders:  Depressive Disorders: Major Depressive Disorder - Severe (296.23)  AXIS I: Generalized Anxiety Disorder and Major Depression, Recurrent severe  AXIS II: Deferred  AXIS III:  Past Medical History   Diagnosis  Date   .  Migraine    .  GERD (gastroesophageal reflux disease)    .  Sinusitis    .  PONV (postoperative nausea and vomiting)    .  Depression    .  Anemia    .  Acid reflux    .  Cancer of skin of back      basal cell; [facility]    AXIS IV: occupational problems, other psychosocial or environmental problems, problems related to social environment and problems with primary support group  AXIS V: 41-50 serious symptoms  Level of Care:  OP Hospital Course:   Paula Horne is a 42 y.o. married white female admitted to [facility] from her private out patient therapist office - Kellie Moor, [facility], for exacerbation of depression and Suicidal attempt by overdosing acetaminophen and plans of taking more with intention to end her life. She was evaluated in the [facility] and given medical clearance and sent to [facility] for stabilization and crisis management.     She was admitted to the adult unit and evaluated by a psychiatrist. Her symptoms were identified and medication management was initiated. She was encouraged to participate in unit programming for  problem solving and coping skills in a therapeutic environment.      Paula Horne was evaluated each day by a clinical provider to assess her progress and response to treatment. She reported poor energy and fatigue and was started on Vyvanse 20 mg to improve this. The following day she noted a migraine and Relpax, and Topamax for prevention. She responded well to all of these and reported no side effects.      Paula Horne participated in the unit programming consistently and had good participation in the groups as well. Her behavior was appropriate and she was kind and respectful to staff and other patients. During the course of her treatment Paula Horne responded well to the therapeutic environment and her affect brightened considerably from flat to more euthymic and congruent. She reported declining numbers for her depression as well as her anxiety.       By the day of discharge Paula Horne was in much improved condition and reported that she was ready to return home. She denied SI/HI or AVH. Paula Horne was in full contact with reality and felt to be stable for discharge with plans to follow up as below. She was provided with samples and prescriptions for 1 month of medication. Consults:  None  Significant Diagnostic Studies:  labs: CBC, CMPUA, UDS  Discharge Vitals:   Blood pressure 106/76, pulse 91, temperature 98.3 F (36.8 C), temperature source Oral, resp. rate 18, height 5\' 3"  (1.6 m), weight 90.266 kg (199 lb), last menstrual period 07/28/2013. Body mass index  is 35.26 kg/(m^2). Lab Results:   Results for orders placed during the hospital encounter of 08/04/13 (from the past 72 hour(s))  COMPREHENSIVE METABOLIC PANEL     Status: Abnormal   Collection Time    08/06/13  6:30 AM      Result Value Range   Sodium 138  135 - 145 mEq/L   Potassium 2.9 (*) 3.5 - 5.1 mEq/L   Chloride 101  96 - 112 mEq/L   CO2 24  19 - 32 mEq/L   Glucose, Bld 98  70 - 99 mg/dL   BUN 5 (*) 6 - 23 mg/dL   Creatinine, Ser 9.62   0.50 - 1.10 mg/dL   Calcium 9.3  8.4 - 95.2 mg/dL   Total Protein 7.4  6.0 - 8.3 g/dL   Albumin 3.8  3.5 - 5.2 g/dL   AST 15  0 - 37 U/L   ALT 11  0 - 35 U/L   Alkaline Phosphatase 122 (*) 39 - 117 U/L   Total Bilirubin 0.4  0.3 - 1.2 mg/dL   GFR calc non Af Amer >90  >90 mL/min   GFR calc Af Amer >90  >90 mL/min   Comment: (NOTE)     The eGFR has been calculated using the CKD EPI equation.     This calculation has not been validated in all clinical situations.     eGFR's persistently <90 mL/min signify possible Chronic Kidney     Disease.     Performed at [facility]    Physical Findings: AIMS: Facial and Oral Movements Muscles of Facial Expression: None, normal Lips and Perioral Area: None, normal Jaw: None, normal Tongue: None, normal,Extremity Movements Upper (arms, wrists, hands, fingers): None, normal Lower (legs, knees, ankles, toes): None, normal, Trunk Movements Neck, shoulders, hips: None, normal, Overall Severity Severity of abnormal movements (highest score from questions above): None, normal Incapacitation due to abnormal movements: None, normal Patient's awareness of abnormal movements (rate only patient's report): No Awareness, Dental Status Current problems with teeth and/or dentures?: No Does patient usually wear dentures?: No  CIWA:  CIWA-Ar Total: 1 COWS:  COWS Total Score: 2  Psychiatric Specialty Exam: See Psychiatric Specialty Exam and Suicide Risk Assessment completed by Attending Physician prior to discharge.  Discharge destination:  Home  Is patient on multiple antipsychotic therapies at discharge:  No   Has Patient had three or more failed trials of antipsychotic monotherapy by history:  No  Recommended Plan for Multiple Antipsychotic Therapies: NA  Discharge Orders   Future Orders Complete By Expires   Diet - low sodium heart healthy  As directed    Discharge instructions  As directed    Comments:     Take all your  medications as prescribed by your mental healthcare provider. Report any adverse effects and or reactions from your medicines to your outpatient provider promptly. Patient is instructed and cautioned to not engage in alcohol and or illegal drug use while on prescription medicines. In the event of worsening symptoms, patient is instructed to call the crisis hotline, 911 and or go to the nearest ED for appropriate evaluation and treatment of symptoms. Follow-up with your primary care provider for your other medical issues, concerns and or health care needs.   Increase activity slowly  As directed        Medication List    STOP taking these medications       chlordiazePOXIDE 25 MG capsule  Commonly known as:  LIBRIUM  diclofenac sodium 1 % Gel  Commonly known as:  VOLTAREN     ziprasidone 60 MG capsule  Commonly known as:  GEODON      TAKE these medications     Indication   DULoxetine 60 MG capsule  Commonly known as:  CYMBALTA  Take 1 capsule (60 mg total) by mouth daily. For depression.   Indication:  Major Depressive Disorder     eletriptan 20 MG tablet  Commonly known as:  RELPAX  Take 1 tablet (20 mg total) by mouth every 2 (two) hours as needed for migraine. One tablet by mouth at onset of headache. May repeat in 2 hours if headache persists or recurs.      esomeprazole 40 MG capsule  Commonly known as:  NEXIUM  Take 1 capsule (40 mg total) by mouth daily before breakfast. For reflux.   Indication:  Gastroesophageal Reflux Disease with Current Symptoms     lisdexamfetamine 30 MG capsule  Commonly known as:  VYVANSE  Take 1 capsule (30 mg total) by mouth daily. For ADD.      norethindrone 0.35 MG tablet  Commonly known as:  MICRONOR,CAMILA,ERRIN  Take 1 tablet by mouth daily.      topiramate 100 MG tablet  Commonly known as:  TOPAMAX  Take 1 tablet (100 mg total) by mouth daily. For migraine prophylaxis and mood stabilization.   Indication:  Migraine Headache,  Manic-Depression that is Resistant to Treatment     traZODone 50 MG tablet  Commonly known as:  DESYREL  Take one tablet if needed at bedtime for insomnia.   Indication:  Trouble Sleeping           Follow-up Information   Follow up with Egbert Garibaldi On 08/11/2013. (Monday, August 11, 2013 at 5:00 PM for therapy)    Contact information:   [address] [place], [place]   [address]  [phone]      Follow up with Dr. Len Blalock  On 08/18/2013. (Monday, August 18, 2013 at 9:30 AM for medication management)    Contact information:   [address] [place], [place]   [address]   [phone]      Follow-up recommendations:   Activities: Resume activity as tolerated. Diet: Heart healthy low sodium diet Tests: Follow up testing will be determined by your out patient provider. Comments:    Total Discharge Time:  Greater than 30 minutes.  Signed: Rona Ravens. Mashburn RPAC 11:50 AM 08/08/2013  Patient was seen for psychiatric evaluation, suicide risk assessment, case discussed with her treatment team and physician extender. Treatment plan made along with the disposition plans.Reviewed the information documented and agree with the treatment plan.   Lonney Revak,[name] 08/08/2013 12:17 PM

## 2013-08-08 NOTE — [facility] Suicide Risk Assessment (Signed)
Suicide Risk Assessment  Discharge Assessment     Demographic Factors:  Adolescent or young adult, Caucasian and Low socioeconomic status  Mental Status Per Nursing Assessment::   On Admission:   (denies)  Current Mental Status by Physician: Patient is calm and cooperative. She has depressed mood and constricted affect. She has normal speech and thought process he has no suicidal or homicidal ideation intentions or plans. Patient has no evidence of psychotic symptoms.  Loss Factors: Financial problems/change in socioeconomic status  Historical Factors: Impulsivity  Risk Reduction Factors:   Responsible for children under 78 years of age, Sense of responsibility to family, Religious beliefs about death, Employed, Living with another person, especially a relative, Positive social support, Positive therapeutic relationship and Positive coping skills or problem solving skills  Continued Clinical Symptoms:  Depression:   Recent sense of peace/wellbeing Previous Psychiatric Diagnoses and Treatments Medical Diagnoses and Treatments/Surgeries  Cognitive Features That Contribute To Risk:  Polarized thinking    Suicide Risk:  Moderate:  Frequent suicidal ideation with limited intensity, and duration, some specificity in terms of plans, no associated intent, good self-control, limited dysphoria/symptomatology, some risk factors present, and identifiable protective factors, including available and accessible social support.  Discharge Diagnoses:   AXIS I:  Major Depression, Recurrent severe AXIS II:  Deferred AXIS III:   Past Medical History  Diagnosis Date  . Migraine   . GERD (gastroesophageal reflux disease)   . Sinusitis   . PONV (postoperative nausea and vomiting)   . Depression   . Anemia   . Acid reflux   . Cancer of skin of back     basal cell; [facility]   AXIS IV:  economic problems, other psychosocial or environmental problems, problems related to social environment and  problems with primary support group AXIS V:  61-70 mild symptoms  Plan Of Care/Follow-up recommendations:  Activity:  As tolerated Diet:  regular  Is patient on multiple antipsychotic therapies at discharge:  No   Has Patient had three or more failed trials of antipsychotic monotherapy by history:  No  Recommended Plan for Multiple Antipsychotic Therapies: NA  Saabir Blyth,[name] 08/08/2013, 11:28 AM

## 2013-08-08 NOTE — Progress Notes (Signed)
Adult Psychoeducational Group Note  Date:  08/08/2013 Time:  1:38 PM  Group Topic/Focus:  Early Warning Signs:   The focus of this group is to help patients identify signs or symptoms they exhibit before slipping into an unhealthy state or crisis.  Participation Level:  Active  Participation Quality:  Appropriate and Attentive  Affect:  Appropriate  Cognitive:  Alert and Appropriate  Insight: Good  Engagement in Group:  Engaged  Modes of Intervention:  Activity, Discussion, Exploration, Socialization and Support  Additional Comments:  Pt came to group and shared that when her mind races and being quick to cry are her two biggest early warning signs. Pt plans on using writing a letter to someone who has hurt her as a coping skill.  Cathlean Cower 08/08/2013, 1:38 PM

## 2013-08-08 NOTE — Progress Notes (Signed)
D: Pt denies SI/HI/AVH. Pt is pleasant and cooperative. Pt states she ready to go home. Pt states Migraine medication working for her.   A: Pt was offered support and encouragement. Pt was given scheduled medications. Pt was encourage to attend groups. Q 15 minute checks were done for safety.   R:Pt attends groups and interacts well with peers and staff. Pt is taking medication. Pt has no complaints at this time.Pt receptive to treatment and safety maintained on unit.

## 2013-08-08 NOTE — Progress Notes (Signed)
[facility] Adult Case Management Discharge Plan :  Will you be returning to the same living situation after discharge: Yes,  returning home At discharge, do you have transportation home?:Yes,  has own car here Do you have the ability to pay for your medications:Yes,  access to meds  Release of information consent forms completed and in the chart;  Patient's signature needed at discharge.  Patient to Follow up at: Follow-up Information   Follow up with Egbert Garibaldi On 08/11/2013. (Monday, August 11, 2013 at 5:00 PM for therapy)    Contact information:   [address] [place], [place]   [address]  [phone]      Follow up with Dr. Len Blalock  On 08/18/2013. (Monday, August 18, 2013 at 9:30 AM for medication management)    Contact information:   [address] [place], [place]   [address]   [phone]      Patient denies SI/HI:   Yes,  denies SI/HI    Safety Planning and Suicide Prevention discussed:  Yes,  discussed with pt and pt's husband.  See suicide prevention education note.  Carmina Miller 08/08/2013, 10:20 AM

## 2013-08-08 NOTE — BHH Group Notes (Signed)
[facility] LCSW Aftercare Discharge Planning Group Note   08/08/2013 8:45 AM  Participation Quality:  Alert and Appropriate   Mood/Affect:  Appropriate and Calm  Depression Rating:  0  Anxiety Rating:  0  Thoughts of Suicide:  Pt denies SI/HI  Will you contract for safety?   Yes  Current AVH:  Pt denies  Plan for Discharge/Comments:  Pt attended discharge planning group and actively participated in group.  CSW provided pt with today's workbook.  Pt reports feeling stable to d/c today.  Pt has follow up scheduled and will return home in [place].  No further needs voiced by pt at this time.    Transportation Means: Pt reports access to transportation  Supports: No supports mentioned at this time  Reyes Ivan, LCSW 08/08/2013 10:14 AM

## 2013-08-08 NOTE — Progress Notes (Signed)
Patient ID: Paula Horne, female   DOB: 09/14/1971, 42 y.o.   MRN: [id number] Pt. Discharged per MD orders;  PT. Currently denies any HI/SI or AVH.  Pt. Was given education regarding follow up appointments and medications by RN and expresses understanding of all.  Pt. Denies any questions or concerns about the medications.  Pt. Was escorted to the search room to retrieve his/her belongings by RN before being discharged to the hospital lobby.  Pt. Received work note prior to discharge per her request.

## 2013-08-08 NOTE — Telephone Encounter (Signed)
Per Dr. Chrissie Noa Hopper---No control substances from [facility] Patient should receive all medications from psychiatrist

## 2013-08-12 NOTE — Progress Notes (Signed)
Patient Discharge Instructions:  After Visit Summary (AVS):   Faxed to:  08/12/13 Discharge Summary Note:   Faxed to:  08/12/13 Psychiatric Admission Assessment Note:   Faxed to:  08/12/13 Suicide Risk Assessment - Discharge Assessment:   Faxed to:  08/12/13 Faxed/Sent to the Next [facility] Care provider:  08/12/13 Faxed to [facility] @ [phone] Faxed to Dr. Len Blalock @ [phone]  Jerelene Redden, 08/12/2013, 2:50 PM

## 2013-08-21 ENCOUNTER — Other Ambulatory Visit: Payer: Self-pay

## 2013-08-21 ENCOUNTER — Other Ambulatory Visit (HOSPITAL_COMMUNITY): Payer: Self-pay | Admitting: Psychiatry

## 2014-05-21 ENCOUNTER — Emergency Department (HOSPITAL_COMMUNITY): Payer: Medicaid Other

## 2014-05-21 ENCOUNTER — Ambulatory Visit (INDEPENDENT_AMBULATORY_CARE_PROVIDER_SITE_OTHER): Payer: Self-pay | Admitting: Emergency Medicine

## 2014-05-21 ENCOUNTER — Encounter (HOSPITAL_COMMUNITY): Payer: Self-pay | Admitting: Emergency Medicine

## 2014-05-21 ENCOUNTER — Emergency Department (HOSPITAL_COMMUNITY)
Admission: EM | Admit: 2014-05-21 | Discharge: 2014-05-21 | Disposition: A | Discharge status: Home / Self Care | Payer: Medicaid Other | Attending: Emergency Medicine | Admitting: Emergency Medicine

## 2014-05-21 VITALS — BP 112/80 | HR 81 | Temp 98.1°F | Resp 18 | Ht 64.0 in | Wt 209.0 lb

## 2014-05-21 DIAGNOSIS — Z862 Personal history of diseases of the blood and blood-forming organs and certain disorders involving the immune mechanism: Secondary | ICD-10-CM | POA: Insufficient documentation

## 2014-05-21 DIAGNOSIS — R1084 Generalized abdominal pain: Secondary | ICD-10-CM | POA: Diagnosis not present

## 2014-05-21 DIAGNOSIS — Z79899 Other long term (current) drug therapy: Secondary | ICD-10-CM | POA: Insufficient documentation

## 2014-05-21 DIAGNOSIS — Z85828 Personal history of other malignant neoplasm of skin: Secondary | ICD-10-CM | POA: Diagnosis not present

## 2014-05-21 DIAGNOSIS — R109 Unspecified abdominal pain: Secondary | ICD-10-CM | POA: Insufficient documentation

## 2014-05-21 DIAGNOSIS — Z8659 Personal history of other mental and behavioral disorders: Secondary | ICD-10-CM | POA: Diagnosis not present

## 2014-05-21 DIAGNOSIS — Z9089 Acquired absence of other organs: Secondary | ICD-10-CM | POA: Diagnosis not present

## 2014-05-21 DIAGNOSIS — R10A3 Flank pain, bilateral: Secondary | ICD-10-CM

## 2014-05-21 DIAGNOSIS — K219 Gastro-esophageal reflux disease without esophagitis: Secondary | ICD-10-CM | POA: Insufficient documentation

## 2014-05-21 DIAGNOSIS — R1012 Left upper quadrant pain: Secondary | ICD-10-CM

## 2014-05-21 DIAGNOSIS — Z8679 Personal history of other diseases of the circulatory system: Secondary | ICD-10-CM | POA: Insufficient documentation

## 2014-05-21 DIAGNOSIS — R1011 Right upper quadrant pain: Secondary | ICD-10-CM

## 2014-05-21 LAB — COMPREHENSIVE METABOLIC PANEL
ALBUMIN: 4.1 g/dL (ref 3.5–5.2)
ALK PHOS: 103 U/L (ref 39–117)
ALT: 10 U/L (ref 0–35)
AST: 16 U/L (ref 0–37)
Anion gap: 16 — ABNORMAL HIGH (ref 5–15)
BUN: 10 mg/dL (ref 6–23)
CALCIUM: 9.3 mg/dL (ref 8.4–10.5)
CO2: 23 mEq/L (ref 19–32)
Chloride: 99 mEq/L (ref 96–112)
Creatinine, Ser: 0.69 mg/dL (ref 0.50–1.10)
GFR calc Af Amer: >90 mL/min (ref >90)
GFR calc non Af Amer: >90 mL/min (ref >90)
Glucose, Bld: 93 mg/dL (ref 70–99)
POTASSIUM: 3.3 meq/L — AB (ref 3.7–5.3)
SODIUM: 138 meq/L (ref 137–147)
TOTAL PROTEIN: 7.9 g/dL (ref 6.0–8.3)
Total Bilirubin: 0.5 mg/dL (ref 0.3–1.2)

## 2014-05-21 LAB — POCT UA - MICROSCOPIC ONLY
CRYSTALS, UR, HPF, POC: NEGATIVE
Casts, Ur, LPF, POC: NEGATIVE
Mucus, UA: NEGATIVE
Yeast, UA: NEGATIVE

## 2014-05-21 LAB — COMPLETE METABOLIC PANEL WITH GFR
ALK PHOS: 90 U/L (ref 39–117)
ALT: 9 U/L (ref 0–35)
AST: 14 U/L (ref 0–37)
Albumin: 4.4 g/dL (ref 3.5–5.2)
BILIRUBIN TOTAL: 0.6 mg/dL (ref 0.2–1.2)
BUN: 11 mg/dL (ref 6–23)
CO2: 28 mEq/L (ref 19–32)
CREATININE: 0.76 mg/dL (ref 0.50–1.10)
Calcium: 9.6 mg/dL (ref 8.4–10.5)
Chloride: 99 mEq/L (ref 96–112)
GLUCOSE: 93 mg/dL (ref 70–99)
Potassium: 3.6 mEq/L (ref 3.5–5.3)
Sodium: 138 mEq/L (ref 135–145)
Total Protein: 7.4 g/dL (ref 6.0–8.3)

## 2014-05-21 LAB — POCT CBC
Granulocyte percent: 77.2 %G (ref 37–80)
HCT, POC: 42.8 % (ref 37.7–47.9)
Hemoglobin: 13.6 g/dL (ref 12.2–16.2)
Lymph, poc: 1.6 (ref 0.6–3.4)
MCH: 26 pg — AB (ref 27–31.2)
MCHC: 31.8 g/dL (ref 31.8–35.4)
MCV: 81.9 fL (ref 80–97)
MID (CBC): 0.4 (ref 0–0.9)
MPV: 9 fL (ref 0–99.8)
POC Granulocyte: 6.6 (ref 2–6.9)
POC LYMPH PERCENT: 18.4 %L (ref 10–50)
POC MID %: 4.4 % (ref 0–12)
Platelet Count, POC: 324 10*3/uL (ref 142–424)
RBC: 5.23 M/uL (ref 4.04–5.48)
RDW, POC: 15.3 %
WBC: 8.5 10*3/uL (ref 4.6–10.2)

## 2014-05-21 LAB — POCT URINALYSIS DIPSTICK
Glucose, UA: NEGATIVE
Ketones, UA: >=160
LEUKOCYTES UA: NEGATIVE
NITRITE UA: NEGATIVE
PROTEIN UA: 100
Spec Grav, UA: 1.02
UROBILINOGEN UA: 1
pH, UA: 5.5

## 2014-05-21 LAB — GLUCOSE, POCT (MANUAL RESULT ENTRY): POC Glucose: 91 mg/dl (ref 70–99)

## 2014-05-21 LAB — POCT URINE PREGNANCY: Preg Test, Ur: NEGATIVE

## 2014-05-21 LAB — LIPASE, BLOOD: Lipase: 21 U/L (ref 11–59)

## 2014-05-21 LAB — LIPASE: Lipase: 11 U/L (ref 0–75)

## 2014-05-21 MED ORDER — POTASSIUM CHLORIDE CRYS ER 20 MEQ PO TBCR
40.0000 meq | EXTENDED_RELEASE_TABLET | Freq: Once | ORAL | Status: AC
  Administered 2014-05-21: 40 meq via ORAL
  Filled 2014-05-21: qty 2

## 2014-05-21 MED ORDER — TRAMADOL HCL 50 MG PO TABS
50.0000 mg | ORAL_TABLET | Freq: Once | ORAL | Status: AC
  Administered 2014-05-21: 50 mg via ORAL
  Filled 2014-05-21: qty 1

## 2014-05-21 MED ORDER — TRAMADOL HCL 50 MG PO TABS
50.0000 mg | ORAL_TABLET | Freq: Four times a day (QID) | ORAL | Status: DC | PRN
Start: 2014-05-21 — End: 2015-11-17

## 2014-05-21 MED ORDER — ONDANSETRON HCL 4 MG PO TABS: 4.0000 mg | ORAL_TABLET | Freq: Four times a day (QID) | ORAL | Status: DC

## 2014-05-21 NOTE — ED Provider Notes (Signed)
CSN: [id number]     Arrival date & time 05/21/14  1234 History   First MD Initiated Contact with Patient 05/21/14 1254     Chief Complaint  Patient presents with  . Flank Pain     (Consider location/radiation/quality/duration/timing/severity/associated sxs/prior Treatment) HPI Comments: Patient presents today from [facility] due to bilateral flank pain.  She reports that the pain has been intermittent over the past week.  She has had associated nausea, vomiting, diarrhea, epigastric abdominal pain, and neck pain.  She denies any acute injury or trauma.  She reports that she has taken Tylenol for the pain without relief.  She denies dysuria, fever, chills, increased urinary frequency, or urinary urgency.  She had a UA at [facility], which showed some ketones and also some blood.  No signs of infection on the UA.  She was sent to the ED to obtain imaging of the kidneys and additional blood work.  She denies any history of kidney stones.  Past surgical history significant for Cholecystectomy 13 years ago.       Patient is a 43 y.o. female presenting with flank pain. The history is provided by the patient.  Flank Pain Pertinent negatives include no chills or fever.    Past Medical History  Diagnosis Date  . Migraine   . GERD (gastroesophageal reflux disease)   . Sinusitis   . PONV (postoperative nausea and vomiting)   . Depression   . Anemia   . Acid reflux   . Cancer of skin of back     basal cell; [facility]   Past Surgical History  Procedure Laterality Date  . Cholecystectomy  2001  . Esophageal dilation  04/2007    Dr.Kaplan   Family History  Problem Relation Age of Onset  . Depression Father   . Skin cancer Father   . Colon cancer Maternal Uncle   . Depression Mother   . Arthritis Mother     ? type  . Alcohol abuse Brother   . Alcohol abuse Maternal Grandmother   . Stroke Maternal Grandmother   . Alcohol abuse Maternal Grandfather   . Coronary artery disease Paternal  Grandmother   . Ovarian cancer Paternal Grandmother    History  Substance Use Topics  . Smoking status: Never Smoker   . Smokeless tobacco: Never Used  . Alcohol Use: No   OB History   Grav Para Term Preterm Abortions TAB SAB Ect Mult Living   6 2 2  4  4   2      Review of Systems  Constitutional: Negative for fever and chills.  Genitourinary: Positive for flank pain.  All other systems reviewed and are negative.     Allergies  Sulfonamide derivatives and Sulfa antibiotics  Home Medications   Prior to Admission medications   Medication Sig Start Date End Date Taking? Authorizing Provider  esomeprazole (NEXIUM) 40 MG capsule Take 1 capsule (40 mg total) by mouth daily before breakfast. For reflux. 08/08/13  Yes Neil Mashburn, PA-C   BP 113/81  Pulse 94  Temp(Src) 98.6 F (37 C) (Oral)  Resp 16  SpO2 99%  LMP 05/18/2014 Physical Exam  Nursing note and vitals reviewed. Constitutional: She appears well-developed and well-nourished.  HENT:  Head: Normocephalic and atraumatic.  Mouth/Throat: Oropharynx is clear and moist.  Neck: Normal range of motion. Neck supple.  Cardiovascular: Normal rate, regular rhythm and normal heart sounds.   Pulmonary/Chest: Effort normal and breath sounds normal.  Abdominal: Soft. Bowel sounds  are normal. She exhibits no distension and no mass. There is tenderness. There is no rebound and no guarding.  Mild epigastric tenderness to palpation Bilateral CVA tenderness  Musculoskeletal: Normal range of motion.  Neurological: She is alert.  Skin: Skin is warm and dry.  Psychiatric: She has a normal mood and affect.    ED Course  Procedures (including critical care time) Labs Review Labs Reviewed  COMPREHENSIVE METABOLIC PANEL    Imaging Review Ct Abdomen Pelvis Wo Contrast  05/21/2014   CLINICAL DATA:  Bilateral flank pain. Nausea, vomiting and diarrhea.  EXAM: CT ABDOMEN AND PELVIS WITHOUT CONTRAST  TECHNIQUE: Multidetector CT  imaging of the abdomen and pelvis was performed following the standard protocol without IV contrast.  COMPARISON:  02/09/2008  FINDINGS: Kidneys are normal in size, orientation and position. No renal masses or stones. No hydronephrosis. Normal ureters and bladder.  Clear lung bases.  Heart is normal in size.  Normal liver, spleen and pancreas. Gallbladder surgically absent. Mild chronic dilation of the common bile duct. No intrahepatic bile duct dilation. Normal adrenal glands.  Uterus has a lobulated contour consistent with fibroids, stable. No adnexal masses.  No adenopathy.  No abnormal fluid collections.  Normal bowel.  Normal appendix.  Minor degenerative changes noted of the visualized spine.  IMPRESSION: 1. No acute findings. No findings to explain bilateral flank pain or nausea, vomiting or diarrhea. 2. No renal or ureteral stones.  No obstructive uropathy. 3. Status post cholecystectomy.  Stable uterine fibroids.   Electronically Signed   By: Lajean Manes M.D.   On: 05/21/2014 14:26     EKG Interpretation None     2:51 PM Reassessed patient.  She reports that her pain and nausea have improved.  Patient tolerating PO liquids. MDM   Final diagnoses:  None   Patient presenting from [facility] with bilateral flank pain that has been intermittent over the past week.  No acute injury or trauma.  Patient afebrile.  UA did not show evidence of infection.  Labs unremarkable.  No rebound or guarding on abdominal exam.  Negative CT ab/pelvis w/o contrast.  Pain and nausea improved during ED course.  Patient able to tolerate PO liquids prior to discharge.  Feel that the patient is stable for discharge.  Return precautions given.    Hyman Bible, PA-C 05/22/14 2226

## 2014-05-21 NOTE — ED Notes (Signed)
Pt states flank pain, n/v/d for one week.  No difficulty urinating or hx of kidney stones.  No fever.

## 2014-05-21 NOTE — ED Notes (Signed)
Pt at urgent care this morning and sent here by MD.  ? Kidney/pancreas per pt

## 2014-05-21 NOTE — Progress Notes (Signed)
Subjective:    Patient ID: Paula Horne, female    DOB: Feb 02, 1971, 43 y.o.   MRN: [id number]  Flank Pain Associated symptoms include abdominal pain.  Emesis  Associated symptoms include abdominal pain and diarrhea.  Diarrhea  Associated symptoms include abdominal pain and vomiting.   This chart was scribed for Darlyne Russian, MD by Irene Pap, ED Scribe. This patient was seen in room 1 and the patient's care was started at 11:17 AM.  HPI Comments: Paula Horne is a 43 y.o. female with a history of GERD who presents to the [facility] complaining of waxing and waning bilateral flank pain onset one week ago. She reports associated nausea, vomiting, diarrhea, abdominal pain, and neck pain. She reports that she has been taking tylenol, changing appetite, and drinking water and Gatorade to no relief. She reports a history of migraines and a history of cholecystectomy 13 years ago. She states that she takes Nexium for GERD. She denies using BC because she states she is out of work and has no insurance, but states that she had a regular menstrual cycle on Monday that just finished. She denies any history of pancreatic problems. She states that her PCP was Dr. Unice Cobble. She denies smoking or drinking. Patient has a past medical history of depression.   Past Medical History  Diagnosis Date  . Migraine   . GERD (gastroesophageal reflux disease)   . Sinusitis   . PONV (postoperative nausea and vomiting)   . Depression   . Anemia   . Acid reflux   . Cancer of skin of back     basal cell; [facility]   Past Surgical History  Procedure Laterality Date  . Cholecystectomy  2001  . Esophageal dilation  04/2007    Dr.Kaplan   Prior to Admission medications   Medication Sig Start Date End Date Taking? Authorizing Provider  esomeprazole (NEXIUM) 40 MG capsule Take 1 capsule (40 mg total) by mouth daily before breakfast. For reflux. 08/08/13  Yes Nena Polio, PA-C     Review of Systems  Constitutional: Positive for appetite change.  Gastrointestinal: Positive for nausea, vomiting, abdominal pain and diarrhea.  Genitourinary: Positive for flank pain.  Musculoskeletal: Positive for neck pain.     Objective:   Physical Exam  CONSTITUTIONAL: Well developed/well nourished HEAD: Normocephalic/atraumatic EYES: EOMI/PERRL ENMT: Mucous membranes moist NECK: supple no meningeal signs SPINE:entire spine nontender CV: S1/S2 noted, no murmurs/rubs/gallops noted LUNGS: Lungs are clear to auscultation bilaterally, no apparent distress ABDOMEN:Tender mid epigastrium, no masses, no rebound.  GU:no cva tenderness. Tender across flank area bilaterally.  NEURO: Pt is awake/alert, moves all extremitiesx4 EXTREMITIES: pulses normal, full ROM.  SKIN: warm, color normal PSYCH: no abnormalities of mood noted Results for orders placed in visit on 05/21/14  POCT CBC      Result Value Ref Range   WBC 8.5  4.6 - 10.2 K/uL   Lymph, poc 1.6  0.6 - 3.4   POC LYMPH PERCENT 18.4  10 - 50 %L   MID (cbc) 0.4  0 - 0.9   POC MID % 4.4  0 - 12 %M   POC Granulocyte 6.6  2 - 6.9   Granulocyte percent 77.2  37 - 80 %G   RBC 5.23  4.04 - 5.48 M/uL   Hemoglobin 13.6  12.2 - 16.2 g/dL   HCT, POC 42.8  37.7 - 47.9 %   MCV 81.9  80 - 97 fL  MCH, POC 26.0 (*) 27 - 31.2 pg   MCHC 31.8  31.8 - 35.4 g/dL   RDW, POC 15.3     Platelet Count, POC 324  142 - 424 K/uL   MPV 9.0  0 - 99.8 fL  POCT URINE PREGNANCY      Result Value Ref Range   Preg Test, Ur Negative    POCT UA - MICROSCOPIC ONLY      Result Value Ref Range   WBC, Ur, HPF, POC 0-3     RBC, urine, microscopic 5-15     Bacteria, U Microscopic 1+     Mucus, UA neg     Epithelial cells, urine per micros 6-10     Crystals, Ur, HPF, POC neg     Casts, Ur, LPF, POC neg     Yeast, UA neg    GLUCOSE, POCT (MANUAL RESULT ENTRY)      Result Value Ref Range   POC Glucose 91  70 - 99 mg/dl  POCT URINALYSIS DIPSTICK       Result Value Ref Range   Color, UA dark yellow     Clarity, UA cloudy     Glucose, UA neg     Bilirubin, UA moderate     Ketones, UA >=160     Spec Grav, UA 1.020     Blood, UA moderate     pH, UA 5.5     Protein, UA 100     Urobilinogen, UA 1.0     Nitrite, UA neg     Leukocytes, UA Negative     Assessment & Plan:  . She presents with bilateral flank pain and midepigastric abdominal pain with significant ketones in her urine with a normal glucose. She is referred to the hospital for imaging and further evaluation.a markedly abnormal urine. she is ketotic and does show blood and bilirubin in the urinalysis. She will need further imaging. And stat electrolytes and liver function tests.. On blood work done a year ago she was significantly hypokalemic. This has never been treated or addressed. Triage nurse called at [facility] .

## 2014-05-22 NOTE — ED Provider Notes (Signed)
Medical screening examination/treatment/procedure(s) were performed by non-physician practitioner and as supervising physician I was immediately available for consultation/collaboration.  Richarda Blade, MD 05/22/14 [phone]

## 2014-08-17 ENCOUNTER — Encounter (HOSPITAL_COMMUNITY): Payer: Self-pay | Admitting: Emergency Medicine

## 2014-09-03 ENCOUNTER — Ambulatory Visit (INDEPENDENT_AMBULATORY_CARE_PROVIDER_SITE_OTHER): Payer: 59 | Admitting: Internal Medicine

## 2014-09-03 VITALS — BP 100/60 | HR 80 | Temp 98.3°F | Resp 16 | Ht 65.5 in | Wt 215.0 lb

## 2014-09-03 DIAGNOSIS — R059 Cough, unspecified: Secondary | ICD-10-CM

## 2014-09-03 DIAGNOSIS — J029 Acute pharyngitis, unspecified: Secondary | ICD-10-CM

## 2014-09-03 DIAGNOSIS — J040 Acute laryngitis: Secondary | ICD-10-CM

## 2014-09-03 DIAGNOSIS — R05 Cough: Secondary | ICD-10-CM

## 2014-09-03 MED ORDER — AZITHROMYCIN 500 MG PO TABS: 500.0000 mg | ORAL_TABLET | Freq: Every day | ORAL | Status: DC

## 2014-09-03 MED ORDER — HYDROCOD POLST-CHLORPHEN POLST 10-8 MG/5ML PO LQCR: 5.0000 mL | Freq: Two times a day (BID) | ORAL | Status: DC | PRN

## 2014-09-03 NOTE — Progress Notes (Deleted)
[facility] [address], [place] [address] [phone]  Date:  09/03/2014   Name:  Paula Horne   DOB:  [birth year]   MRN:  [id number]  PCP:  Unice Cobble, MD    Chief Complaint: Laryngitis; Cough; and Sore Throat   History of Present Illness:  Paula Horne is a 43 y.o. very pleasant female patient who presents with the following:  ***  Patient Active Problem List   Diagnosis Date Noted  . Major depressive disorder, recurrent episode, severe, without mention of psychotic behavior 08/05/2013  . Suicide attempt by acetaminophen overdose 08/05/2013  . FATIGUE 11/01/2009  . SLEEP DISORDER, CHRONIC 11/03/2008  . HEMANGIOMA, HEPATIC 01/28/2008  . ESOPHAGEAL STRICTURE 01/28/2008  . DYSPEPSIA 01/28/2008  . DEPRESSIVE DISORDER NOT ELSEWHERE CLASSIFIED 01/27/2008  . GERD 01/27/2008  . MIGRAINE 08/12/2007    Past Medical History  Diagnosis Date  . Migraine   . GERD (gastroesophageal reflux disease)   . Sinusitis   . PONV (postoperative nausea and vomiting)   . Depression   . Anemia   . Acid reflux   . Cancer of skin of back     basal cell; [facility]    Past Surgical History  Procedure Laterality Date  . Cholecystectomy  2001  . Esophageal dilation  04/2007    Dr.Kaplan    History  Substance Use Topics  . Smoking status: Never Smoker   . Smokeless tobacco: Never Used  . Alcohol Use: No    Family History  Problem Relation Age of Onset  . Depression Father   . Skin cancer Father   . Colon cancer Maternal Uncle   . Depression Mother   . Arthritis Mother     ? type  . Alcohol abuse Brother   . Alcohol abuse Maternal Grandmother   . Stroke Maternal Grandmother   . Alcohol abuse Maternal Grandfather   . Coronary artery disease Paternal Grandmother   . Ovarian cancer Paternal Grandmother     Allergies  Allergen Reactions  . Sulfonamide Derivatives     REACTION: diffuse rash; no Kathreen Cosier Syndrome  phenomena  . Sulfa  Antibiotics Rash    Medication list has been reviewed and updated.  Current Outpatient Prescriptions on File Prior to Visit  Medication Sig Dispense Refill  . esomeprazole (NEXIUM) 40 MG capsule Take 1 capsule (40 mg total) by mouth daily before breakfast. For reflux. 30 capsule 4  . traMADol (ULTRAM) 50 MG tablet Take 1 tablet (50 mg total) by mouth every 6 (six) hours as needed. 15 tablet 0   No current facility-administered medications on file prior to visit.    Review of Systems:  ***  Physical Examination: Filed Vitals:   09/03/14 0942  BP: 100/60  Pulse: 80  Temp: 98.3 F (36.8 C)  Resp: 16   Filed Vitals:   09/03/14 0942  Height: 5' 5.5" (1.664 m)  Weight: 215 lb (97.523 kg)   Body mass index is 35.22 kg/(m^2). Ideal Body Weight: Weight in (lb) to have BMI = 25: 152.2  ***  Assessment and Plan: *** Signed,  Ellison Carwin, MD

## 2014-09-03 NOTE — Progress Notes (Signed)
   Subjective:    Patient ID: Paula Horne, female    DOB: 07/08/1971, 43 y.o.   MRN: [id number]  HPI Started 1 week with sore throat, hoarseness, congestion, mild cough. No sob , cp, dizzy, syncope.    Review of Systems     Objective:   Physical Exam  Constitutional: She is oriented to person, place, and time. She appears well-developed and well-nourished. No distress.  HENT:  Head: Normocephalic.  Right Ear: External ear normal.  Left Ear: External ear normal.  Nose: Mucosal edema, rhinorrhea and sinus tenderness present. Right sinus exhibits no maxillary sinus tenderness and no frontal sinus tenderness. Left sinus exhibits no maxillary sinus tenderness and no frontal sinus tenderness.  Mouth/Throat: Uvula is midline. Posterior oropharyngeal edema and posterior oropharyngeal erythema present.  Eyes: Conjunctivae and EOM are normal. Pupils are equal, round, and reactive to light.  Neck: Normal range of motion. Neck supple. Thyromegaly present.  Cardiovascular: Normal rate, regular rhythm and normal heart sounds.   Pulmonary/Chest: Effort normal. No tachypnea. No respiratory distress. She has no decreased breath sounds. She has no wheezes. She has rhonchi. She has no rales. She exhibits tenderness.  Musculoskeletal: Normal range of motion.  Neurological: She is alert and oriented to person, place, and time. No cranial nerve deficit. She exhibits normal muscle tone. Coordination normal.  Psychiatric: She has a normal mood and affect. Her behavior is normal. Judgment and thought content normal.          Assessment & Plan:   pharyngitis/cough - azithromax Tussionex

## 2014-09-03 NOTE — Patient Instructions (Signed)
Pharyngitis Pharyngitis is redness, pain, and swelling (inflammation) of your pharynx.  CAUSES  Pharyngitis is usually caused by infection. Most of the time, these infections are from viruses (viral) and are part of a cold. However, sometimes pharyngitis is caused by bacteria (bacterial). Pharyngitis can also be caused by allergies. Viral pharyngitis may be spread from person to person by coughing, sneezing, and personal items or utensils (cups, forks, spoons, toothbrushes). Bacterial pharyngitis may be spread from person to person by more intimate contact, such as kissing.  SIGNS AND SYMPTOMS  Symptoms of pharyngitis include:   Sore throat.   Tiredness (fatigue).   Low-grade fever.   Headache.  Joint pain and muscle aches.  Skin rashes.  Swollen lymph nodes.  Plaque-like film on throat or tonsils (often seen with bacterial pharyngitis). DIAGNOSIS  Your health care provider will ask you questions about your illness and your symptoms. Your medical history, along with a physical exam, is often all that is needed to diagnose pharyngitis. Sometimes, a rapid strep test is done. Other lab tests may also be done, depending on the suspected cause.  TREATMENT  Viral pharyngitis will usually get better in 3-4 days without the use of medicine. Bacterial pharyngitis is treated with medicines that kill germs (antibiotics).  HOME CARE INSTRUCTIONS   Drink enough water and fluids to keep your urine clear or pale yellow.   Only take over-the-counter or prescription medicines as directed by your health care provider:   If you are prescribed antibiotics, make sure you finish them even if you start to feel better.   Do not take aspirin.   Get lots of rest.   Gargle with 8 oz of salt water ( tsp of salt per 1 qt of water) as often as every 1-2 hours to soothe your throat.   Throat lozenges (if you are not at risk for choking) or sprays may be used to soothe your throat. SEEK MEDICAL  CARE IF:   You have large, tender lumps in your neck.  You have a rash.  You cough up green, yellow-brown, or bloody spit. SEEK IMMEDIATE MEDICAL CARE IF:   Your neck becomes stiff.  You drool or are unable to swallow liquids.  You vomit or are unable to keep medicines or liquids down.  You have severe pain that does not go away with the use of recommended medicines.  You have trouble breathing (not caused by a stuffy nose). MAKE SURE YOU:   Understand these instructions.  Will watch your condition.  Will get help right away if you are not doing well or get worse. Document Released: 10/02/2005 Document Revised: 07/23/2013 Document Reviewed: 06/09/2013 [facility] Patient Information 2015 [place], [place]. This information is not intended to replace advice given to you by your health care provider. Make sure you discuss any questions you have with your health care provider. Laryngitis At the top of your windpipe is your voice box. It is the source of your voice. Inside your voice box are 2 bands of muscles called vocal cords. When you breathe, your vocal cords are relaxed and open so that air can get into the lungs. When you decide to say something, these cords come together and vibrate. The sound from these vibrations goes into your throat and comes out through your mouth as sound. Laryngitis is an inflammation of the vocal cords that causes hoarseness, cough, loss of voice, sore throat, and dry throat. Laryngitis can be temporary (acute) or long-term (chronic). Most cases of acute laryngitis  improve with time.Chronic laryngitis lasts for more than 3 weeks. CAUSES Laryngitis can often be related to excessive smoking, talking, or yelling, as well as inhalation of toxic fumes and allergies. Acute laryngitis is usually caused by a viral infection, vocal strain, measles or mumps, or bacterial infections. Chronic laryngitis is usually caused by vocal cord strain, vocal cord injury, postnasal  drip, growths on the vocal cords, or acid reflux. SYMPTOMS   Cough.  Sore throat.  Dry throat. RISK FACTORS  Respiratory infections.  Exposure to irritating substances, such as cigarette smoke, excessive amounts of alcohol, stomach acids, and workplace chemicals.  Voice trauma, such as vocal cord injury from shouting or speaking too loud. DIAGNOSIS  Your cargiver will perform a physical exam. During the physical exam, your caregiver will examine your throat. The most common sign of laryngitis is hoarseness. Laryngoscopy may be necessary to confirm the diagnosis of this condition. This procedure allows your caregiver to look into the larynx. HOME CARE INSTRUCTIONS  Drink enough fluids to keep your urine clear or pale yellow.  Rest until you no longer have symptoms or as directed by your caregiver.  Breathe in moist air.  Take all medicine as directed by your caregiver.  Do not smoke.  Talk as little as possible (this includes whispering).  Write on paper instead of talking until your voice is back to normal.  Follow up with your caregiver if your condition has not improved after 10 days. SEEK MEDICAL CARE IF:   You have trouble breathing.  You cough up blood.  You have persistent fever.  You have increasing pain.  You have difficulty swallowing. MAKE SURE YOU:  Understand these instructions.  Will watch your condition.  Will get help right away if you are not doing well or get worse. Document Released: 10/02/2005 Document Revised: 12/25/2011 Document Reviewed: 12/08/2010 [facility] Patient Information 2015 [place], [place]. This information is not intended to replace advice given to you by your health care provider. Make sure you discuss any questions you have with your health care provider.

## 2015-11-17 ENCOUNTER — Ambulatory Visit (INDEPENDENT_AMBULATORY_CARE_PROVIDER_SITE_OTHER): Payer: 59 | Admitting: Family Medicine

## 2015-11-17 VITALS — BP 126/80 | HR 71 | Temp 97.3°F | Resp 18 | Ht 65.0 in | Wt 234.0 lb

## 2015-11-17 DIAGNOSIS — R05 Cough: Secondary | ICD-10-CM | POA: Diagnosis not present

## 2015-11-17 DIAGNOSIS — R059 Cough, unspecified: Secondary | ICD-10-CM

## 2015-11-17 DIAGNOSIS — J069 Acute upper respiratory infection, unspecified: Secondary | ICD-10-CM | POA: Diagnosis not present

## 2015-11-17 MED ORDER — GUAIFENESIN-CODEINE 100-10 MG/5ML PO SOLN: 5.0000 mL | ORAL | Status: DC | PRN

## 2015-11-17 MED ORDER — IPRATROPIUM BROMIDE 0.03 % NA SOLN: 2.0000 | Freq: Four times a day (QID) | NASAL | Status: DC

## 2015-11-17 MED ORDER — PSEUDOEPHEDRINE HCL ER 120 MG PO TB12: 120.0000 mg | ORAL_TABLET | Freq: Two times a day (BID) | ORAL | Status: DC

## 2015-11-17 NOTE — Patient Instructions (Signed)
Upper Respiratory Infection, Adult Most upper respiratory infections (URIs) are a viral infection of the air passages leading to the lungs. A URI affects the nose, throat, and upper air passages. The most common type of URI is nasopharyngitis and is typically referred to as "the common cold." URIs run their course and usually go away on their own. Most of the time, a URI does not require medical attention, but sometimes a bacterial infection in the upper airways can follow a viral infection. This is called a secondary infection. Sinus and middle ear infections are common types of secondary upper respiratory infections. Bacterial pneumonia can also complicate a URI. A URI can worsen asthma and chronic obstructive pulmonary disease (COPD). Sometimes, these complications can require emergency medical care and may be life threatening.  CAUSES Almost all URIs are caused by viruses. A virus is a type of germ and can spread from one person to another.  RISKS FACTORS You may be at risk for a URI if:   You smoke.   You have chronic heart or lung disease.  You have a weakened defense (immune) system.   You are very young or very old.   You have nasal allergies or asthma.  You work in crowded or poorly ventilated areas.  You work in health care facilities or schools. SIGNS AND SYMPTOMS  Symptoms typically develop 2-3 days after you come in contact with a cold virus. Most viral URIs last 7-10 days. However, viral URIs from the influenza virus (flu virus) can last 14-18 days and are typically more severe. Symptoms may include:   Runny or stuffy (congested) nose.   Sneezing.   Cough.   Sore throat.   Headache.   Fatigue.   Fever.   Loss of appetite.   Pain in your forehead, behind your eyes, and over your cheekbones (sinus pain).  Muscle aches.  DIAGNOSIS  Your health care provider may diagnose a URI by:  Physical exam.  Tests to check that your symptoms are not due to  another condition such as:  Strep throat.  Sinusitis.  Pneumonia.  Asthma. TREATMENT  A URI goes away on its own with time. It cannot be cured with medicines, but medicines may be prescribed or recommended to relieve symptoms. Medicines may help:  Reduce your fever.  Reduce your cough.  Relieve nasal congestion. HOME CARE INSTRUCTIONS   Take medicines only as directed by your health care provider.   Gargle warm saltwater or take cough drops to comfort your throat as directed by your health care provider.  Use a warm mist humidifier or inhale steam from a shower to increase air moisture. This may make it easier to breathe.  Drink enough fluid to keep your urine clear or pale yellow.   Eat soups and other clear broths and maintain good nutrition.   Rest as needed.   Return to work when your temperature has returned to normal or as your health care provider advises. You may need to stay home longer to avoid infecting others. You can also use a face mask and careful hand washing to prevent spread of the virus.  Increase the usage of your inhaler if you have asthma.   Do not use any tobacco products, including cigarettes, chewing tobacco, or electronic cigarettes. If you need help quitting, ask your health care provider. PREVENTION  The best way to protect yourself from getting a cold is to practice good hygiene.   Avoid oral or hand contact with people with cold   symptoms.   Wash your hands often if contact occurs.  There is no clear evidence that vitamin C, vitamin E, echinacea, or exercise reduces the chance of developing a cold. However, it is always recommended to get plenty of rest, exercise, and practice good nutrition.  SEEK MEDICAL CARE IF:   You are getting worse rather than better.   Your symptoms are not controlled by medicine.   You have chills.  You have worsening shortness of breath.  You have brown or red mucus.  You have yellow or brown nasal  discharge.  You have pain in your face, especially when you bend forward.  You have a fever.  You have swollen neck glands.  You have pain while swallowing.  You have white areas in the back of your throat. SEEK IMMEDIATE MEDICAL CARE IF:   You have severe or persistent:  Headache.  Ear pain.  Sinus pain.  Chest pain.  You have chronic lung disease and any of the following:  Wheezing.  Prolonged cough.  Coughing up blood.  A change in your usual mucus.  You have a stiff neck.  You have changes in your:  Vision.  Hearing.  Thinking.  Mood. MAKE SURE YOU:   Understand these instructions.  Will watch your condition.  Will get help right away if you are not doing well or get worse.   This information is not intended to replace advice given to you by your health care provider. Make sure you discuss any questions you have with your health care provider.   Document Released: 03/28/2001 Document Revised: 02/16/2015 Document Reviewed: 01/07/2014 Elsevier Interactive Patient Education 2016 [employer]  

## 2015-11-17 NOTE — Progress Notes (Signed)
Subjective:  By signing my name below, I, Moises Blood, attest that this documentation has been prepared under the direction and in the presence of Delman Cheadle, MD. Electronically Signed: Moises Blood, [place]. 11/17/2015 , 1:16 PM .  Patient was seen in Room 11 .   Patient ID: Paula Horne, female    DOB: 01-Jul-1971, 45 y.o.   MRN: [id number] Chief Complaint  Patient presents with  . Abdominal Pain  . Nasal Congestion  . Chest congestion  . Cough    Slightly productive  . Immunizations    Flu vaccine   HPI Paula Horne is a 45 y.o. female who presents to [facility] complaining of nasal and chest congestion with productive cough and abd pain that started 3 days ago. Paula Horne had a headache yesterday and wasn't able to work. Paula Horne tried to go to work today but started having abdominal pain with chills and sore throat. Paula Horne's taken nyquil and dayquil without resolve. Paula Horne denies fever, sweats, sinus pressure, urinary symptoms, vomiting, nausea, blood in stool. Paula Horne has sick contact from her 45 year old who has a cold. Paula Horne requests to have a flu shot today.   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  Paula Horne also has a 45 year old and a 45 year old at home.   Past Medical History  Diagnosis Date  . Migraine   . GERD (gastroesophageal reflux disease)   . Sinusitis   . PONV (postoperative nausea and vomiting)   . Depression   . Anemia   . Acid reflux   . Cancer of skin of back     basal cell; [facility]   Prior to Admission medications   Medication Sig Start Date End Date Taking? Authorizing Provider  azithromycin (ZITHROMAX) 500 MG tablet Take 1 tablet (500 mg total) by mouth daily. Patient not taking: Reported on 11/17/2015 09/03/14   Orma Flaming, MD  chlorpheniramine-HYDROcodone [facility]) 10-8 MG/5ML [facility] Take 5 mLs by mouth every 12 (twelve) hours as needed for cough (cough). Patient not taking: Reported on 11/17/2015 09/03/14   Orma Flaming, MD  esomeprazole  (NEXIUM) 40 MG capsule Take 1 capsule (40 mg total) by mouth daily before breakfast. For reflux. Patient not taking: Reported on 11/17/2015 08/08/13   Ruben Im, PA-C  traMADol (ULTRAM) 50 MG tablet Take 1 tablet (50 mg total) by mouth every 6 (six) hours as needed. Patient not taking: Reported on 11/17/2015 05/21/14   Hyman Bible, PA-C   Allergies  Allergen Reactions  . Sulfonamide Derivatives     REACTION: diffuse rash; no Kathreen Cosier Syndrome  phenomena  . Sulfa Antibiotics Rash    Review of Systems  Constitutional: Positive for chills, appetite change and fatigue. Negative for fever and diaphoresis.  HENT: Positive for congestion and sore throat. Negative for sinus pressure.   Gastrointestinal: Positive for abdominal pain and diarrhea. Negative for nausea, vomiting, constipation and blood in stool.  Genitourinary: Negative for dysuria, frequency and hematuria.       Objective:   Physical Exam  Constitutional: Paula Horne is oriented to person, place, and time. Paula Horne appears well-developed and well-nourished. No distress.  HENT:  Head: Normocephalic and atraumatic.  Right Ear: Tympanic membrane normal.  Left Ear: Tympanic membrane normal.  Nose: Nose normal.  Mouth/Throat: Posterior oropharyngeal erythema present.  2+ tonsils  Eyes: EOM are normal. Pupils are equal, round, and reactive to light.  Neck: Neck supple.  Cardiovascular: Normal rate, regular rhythm,  S1 normal, S2 normal and normal heart sounds.   No murmur heard. Pulmonary/Chest: Effort normal and breath sounds normal. No respiratory distress. Paula Horne has no wheezes.  Abdominal: Soft. Bowel sounds are normal. Paula Horne exhibits no distension. There is no hepatosplenomegaly. There is no tenderness. There is no CVA tenderness.  Musculoskeletal: Normal range of motion.  Lymphadenopathy:    Paula Horne has cervical adenopathy (questionable, right > left).  Neurological: Paula Horne is alert and oriented to person, place, and time.  Skin: Skin  is warm and dry.  Psychiatric: Paula Horne has a normal mood and affect. Her behavior is normal.  Nursing note and vitals reviewed.   BP 126/80 mmHg  Pulse 71  Temp(Src) 97.3 F (36.3 C) (Oral)  Resp 18  Ht 5\' 5"  (1.651 m)  Wt 234 lb (106.142 kg)  BMI 38.94 kg/m2  SpO2 98%    Assessment & Plan:   1. Acute upper respiratory infection   2. Cough     Meds ordered this encounter  Medications  . pseudoephedrine (SUDAFED 12 HOUR) 120 MG 12 hr tablet    Sig: Take 1 tablet (120 mg total) by mouth 2 (two) times daily.    Dispense:  30 tablet    Refill:  0  . ipratropium (ATROVENT) 0.03 % nasal spray    Sig: Place 2 sprays into the nose 4 (four) times daily.    Dispense:  30 mL    Refill:  1  . guaiFENesin-codeine 100-10 MG/5ML syrup    Sig: Take 5-10 mLs by mouth every 4 (four) hours as needed for cough.    Dispense:  180 mL    Refill:  0    I personally performed the services described in this documentation, which was scribed in my presence. The recorded information has been reviewed and considered, and addended by me as needed.  Delman Cheadle, MD MPH

## 2016-12-18 ENCOUNTER — Ambulatory Visit (INDEPENDENT_AMBULATORY_CARE_PROVIDER_SITE_OTHER): Payer: 59 | Admitting: Emergency Medicine

## 2016-12-18 VITALS — BP 110/70 | HR 85 | Temp 98.1°F | Resp 18 | Ht 65.0 in | Wt 230.2 lb

## 2016-12-18 DIAGNOSIS — R112 Nausea with vomiting, unspecified: Secondary | ICD-10-CM

## 2016-12-18 DIAGNOSIS — M791 Myalgia, unspecified site: Secondary | ICD-10-CM

## 2016-12-18 DIAGNOSIS — B349 Viral infection, unspecified: Secondary | ICD-10-CM | POA: Insufficient documentation

## 2016-12-18 MED ORDER — ONDANSETRON HCL 4 MG PO TABS: 4.0000 mg | ORAL_TABLET | Freq: Three times a day (TID) | ORAL | 0 refills | Status: DC | PRN

## 2016-12-18 MED ORDER — HYDROCODONE-ACETAMINOPHEN 5-325 MG PO TABS: 1.0000 | ORAL_TABLET | Freq: Four times a day (QID) | ORAL | 0 refills | Status: DC | PRN

## 2016-12-18 NOTE — Progress Notes (Signed)
Paula Horne 46 y.o.   Chief Complaint  Patient presents with  . Abdominal Pain    x 4 days  . Emesis    HISTORY OF PRESENT ILLNESS: This is a 46 y.o. female complaining of flu-like symptoms since last week; had n/v, diarrhea, fever, chills, but mostly achiness; daughter and son also sick at home. Had some mild diffuse abdominal pain but none now; stopped vomiting early this am after she took some Zofran; school teacher.  Influenza  This is a new problem. The current episode started in the past 7 days (4-5 days ago). The problem occurs intermittently. The problem has been waxing and waning. Associated symptoms include abdominal pain, anorexia, chills, fatigue, a fever, myalgias, nausea, urinary symptoms, vertigo, vomiting and weakness. Pertinent negatives include no arthralgias, chest pain, congestion, coughing, headaches, neck pain, rash or sore throat. Nothing aggravates the symptoms. She has tried nothing for the symptoms.     Prior to Admission medications   Medication Sig Start Date End Date Taking? Authorizing Provider  pseudoephedrine (SUDAFED 12 HOUR) 120 MG 12 hr tablet Take 1 tablet (120 mg total) by mouth 2 (two) times daily. 11/17/15  Yes Shawnee Knapp, MD  guaiFENesin-codeine 100-10 MG/5ML syrup Take 5-10 mLs by mouth every 4 (four) hours as needed for cough. Patient not taking: Reported on 12/18/2016 11/17/15   Shawnee Knapp, MD  ipratropium (ATROVENT) 0.03 % nasal spray Place 2 sprays into the nose 4 (four) times daily. Patient not taking: Reported on 12/18/2016 11/17/15   Shawnee Knapp, MD    Allergies  Allergen Reactions  . Sulfonamide Derivatives     REACTION: diffuse rash; no Paula Horne Syndrome  phenomena  . Sulfa Antibiotics Rash    Patient Active Problem List   Diagnosis Date Noted  . Major depressive disorder, recurrent episode, severe, without mention of psychotic behavior 08/05/2013  . Suicide attempt by acetaminophen overdose ([place]) 08/05/2013  . FATIGUE  11/01/2009  . SLEEP DISORDER, CHRONIC 11/03/2008  . HEMANGIOMA, HEPATIC 01/28/2008  . ESOPHAGEAL STRICTURE 01/28/2008  . DYSPEPSIA 01/28/2008  . DEPRESSIVE DISORDER NOT ELSEWHERE CLASSIFIED 01/27/2008  . GERD 01/27/2008  . MIGRAINE 08/12/2007    Past Medical History:  Diagnosis Date  . Acid reflux   . Anemia   . Cancer of skin of back    basal cell; [facility]  . Depression   . GERD (gastroesophageal reflux disease)   . Migraine   . PONV (postoperative nausea and vomiting)   . Sinusitis     Past Surgical History:  Procedure Laterality Date  . CHOLECYSTECTOMY  2001  . ESOPHAGEAL DILATION  04/2007   [place]    Social History   Social History  . Marital status: Married    Spouse name: N/A  . Number of children: N/A  . Years of education: N/A   Occupational History  . Not on file.   Social History Main Topics  . Smoking status: Never Smoker  . Smokeless tobacco: Never Used  . Alcohol use No  . Drug use: No  . Sexual activity: Yes    Birth control/ protection: Pill   Other Topics Concern  . Not on file   Social History Narrative  . No narrative on file    Family History  Problem Relation Age of Onset  . Depression Father   . Skin cancer Father   . Colon cancer Maternal Uncle   . Depression Mother   . Arthritis Mother     ?  type  . Alcohol abuse Brother   . Alcohol abuse Maternal Grandmother   . Stroke Maternal Grandmother   . Alcohol abuse Maternal Grandfather   . Coronary artery disease Paternal Grandmother   . Ovarian cancer Paternal Grandmother      Review of Systems  Constitutional: Positive for chills, fatigue and fever.  HENT: Negative for congestion, ear discharge, ear pain, nosebleeds and sore throat.   Eyes: Negative for blurred vision, double vision, discharge and redness.  Respiratory: Negative for cough, shortness of breath and wheezing.   Cardiovascular: Negative for chest pain and palpitations.  Gastrointestinal: Positive for  abdominal pain, anorexia, diarrhea, nausea and vomiting. Negative for blood in stool.  Genitourinary: Negative for dysuria and hematuria.  Musculoskeletal: Positive for myalgias. Negative for arthralgias and neck pain.  Skin: Negative for rash.  Neurological: Positive for vertigo and weakness. Negative for dizziness and headaches.  All other systems reviewed and are negative.  Vitals:   12/18/16 0826  BP: 110/70  Pulse: 85  Resp: 18  Temp: 98.1 F (36.7 C)     Physical Exam  Constitutional: She is oriented to person, place, and time. She appears well-developed and well-nourished.  HENT:  Head: Normocephalic and atraumatic.  Right Ear: Hearing, tympanic membrane, external ear and ear canal normal.  Left Ear: Hearing, tympanic membrane, external ear and ear canal normal.  Nose: Nose normal.  Mouth/Throat: Oropharynx is clear and moist. No oropharyngeal exudate.  Eyes: Conjunctivae and EOM are normal. Pupils are equal, round, and reactive to light.  Neck: Normal range of motion. Neck supple. No JVD present. No thyromegaly present.  Cardiovascular: Normal rate, regular rhythm and normal heart sounds.   Pulmonary/Chest: Effort normal and breath sounds normal.  Abdominal: Soft. Bowel sounds are normal. She exhibits no distension. There is no tenderness.  Musculoskeletal: Normal range of motion.  Lymphadenopathy:    She has no cervical adenopathy.  Neurological: She is alert and oriented to person, place, and time. No sensory deficit. She exhibits normal muscle tone.  Skin: Skin is warm and dry. Capillary refill takes less than 2 seconds.  Psychiatric: She has a normal mood and affect. Her behavior is normal.  Vitals reviewed.    ASSESSMENT & PLAN: Paula Horne was seen today for abdominal pain and emesis.  Diagnoses and all orders for this visit:  Viral illness  Non-intractable vomiting with nausea, unspecified vomiting type  Generalized muscle ache  Other orders -      ondansetron (ZOFRAN) 4 MG tablet; Take 1 tablet (4 mg total) by mouth every 8 (eight) hours as needed for nausea or vomiting. -     HYDROcodone-acetaminophen (NORCO) 5-325 MG tablet; Take 1 tablet by mouth every 6 (six) hours as needed for moderate pain.    Patient Instructions       IF you received an x-ray today, you will receive an invoice from [facility]. Please contact [facility] at [phone] with questions or concerns regarding your invoice.   IF you received labwork today, you will receive an invoice from [facility]. Please contact [facility] at [phone] with questions or concerns regarding your invoice.   Our billing staff will not be able to assist you with questions regarding bills from these companies.  You will be contacted with the lab results as soon as they are available. The fastest way to get your results is to activate your My Chart account. Instructions are located on the last page of this paperwork. If you have not heard from [place]  regarding the results in 2 weeks, please contact this office.     Viral Illness, Adult Viruses are tiny germs that can get into a person's body and cause illness. There are many different types of viruses, and they cause many types of illness. Viral illnesses can range from mild to severe. They can affect various parts of the body. Common illnesses that are caused by a virus include colds and the flu. Viral illnesses also include serious conditions such as HIV/AIDS (human immunodeficiency virus/acquired immunodeficiency syndrome). A few viruses have been linked to certain cancers. What are the causes? Many types of viruses can cause illness. Viruses invade cells in your body, multiply, and cause the infected cells to malfunction or die. When the cell dies, it releases more of the virus. When this happens, you develop symptoms of the illness, and the virus continues to spread to other cells. If the virus takes over the  function of the cell, it can cause the cell to divide and grow out of control, as is the case when a virus causes cancer. Different viruses get into the body in different ways. You can get a virus by:  Swallowing food or water that is contaminated with the virus.  Breathing in droplets that have been coughed or sneezed into the air by an infected person.  Touching a surface that has been contaminated with the virus and then touching your eyes, nose, or mouth.  Being bitten by an insect or animal that carries the virus.  Having sexual contact with a person who is infected with the virus.  Being exposed to blood or fluids that contain the virus, either through an open cut or during a transfusion. If a virus enters your body, your body's defense system (immune system) will try to fight the virus. You may be at higher risk for a viral illness if your immune system is weak. What are the signs or symptoms? Symptoms vary depending on the type of virus and the location of the cells that it invades. Common symptoms of the main types of viral illnesses include: Cold and flu viruses   Fever.  Headache.  Sore throat.  Muscle aches.  Nasal congestion.  Cough. Digestive system (gastrointestinal) viruses   Fever.  Abdominal pain.  Nausea.  Diarrhea. Liver viruses (hepatitis)   Loss of appetite.  Tiredness.  Yellowing of the skin (jaundice). Brain and spinal cord viruses   Fever.  Headache.  Stiff neck.  Nausea and vomiting.  Confusion or sleepiness. Skin viruses   Warts.  Itching.  Rash. Sexually transmitted viruses   Discharge.  Swelling.  Redness.  Rash. How is this treated? Viruses can be difficult to treat because they live within cells. Antibiotic medicines do not treat viruses because these drugs do not get inside cells. Treatment for a viral illness may include:  Resting and drinking plenty of fluids.  Medicines to relieve symptoms. These can  include over-the-counter medicine for pain and fever, medicines for cough or congestion, and medicines to relieve diarrhea.  Antiviral medicines. These drugs are available only for certain types of viruses. They may help reduce flu symptoms if taken early. There are also many antiviral medicines for hepatitis and HIV/AIDS. Some viral illnesses can be prevented with vaccinations. A common example is the flu shot. Follow these instructions at home: Medicines    Take over-the-counter and prescription medicines only as told by your health care provider.  If you were prescribed an antiviral medicine, take it as told by  your health care provider. Do not stop taking the medicine even if you start to feel better.  Be aware of when antibiotics are needed and when they are not needed. Antibiotics do not treat viruses. If your health care provider thinks that you may have a bacterial infection as well as a viral infection, you may get an antibiotic.  Do not ask for an antibiotic prescription if you have been diagnosed with a viral illness. That will not make your illness go away faster.  Frequently taking antibiotics when they are not needed can lead to antibiotic resistance. When this develops, the medicine no longer works against the bacteria that it normally fights. General instructions   Drink enough fluids to keep your urine clear or pale yellow.  Rest as much as possible.  Return to your normal activities as told by your health care provider. Ask your health care provider what activities are safe for you.  Keep all follow-up visits as told by your health care provider. This is important. How is this prevented? Take these actions to reduce your risk of viral infection:  Eat a healthy diet and get enough rest.  Wash your hands often with soap and water. This is especially important when you are in public places. If soap and water are not available, use hand sanitizer.  Avoid close contact  with friends and family who have a viral illness.  If you travel to areas where viral gastrointestinal infection is common, avoid drinking water or eating raw food.  Keep your immunizations up to date. Get a flu shot every year as told by your health care provider.  Do not share toothbrushes, nail clippers, razors, or needles with other people.  Always practice safe sex. Contact a health care provider if:  You have symptoms of a viral illness that do not go away.  Your symptoms come back after going away.  Your symptoms get worse. Get help right away if:  You have trouble breathing.  You have a severe headache or a stiff neck.  You have severe vomiting or abdominal pain. This information is not intended to replace advice given to you by your health care provider. Make sure you discuss any questions you have with your health care provider. Document Released: 02/11/2016 Document Revised: 03/15/2016 Document Reviewed: 02/11/2016 Elsevier Interactive Patient Education  2017 Elsevier Inc.      Agustina Caroli, MD [facility]

## 2016-12-18 NOTE — Patient Instructions (Addendum)
   IF you received an x-ray today, you will receive an invoice from [facility]. Please contact  Radiology at [phone] with questions or concerns regarding your invoice.   IF you received labwork today, you will receive an invoice from [facility]. Please contact [facility] at [phone] with questions or concerns regarding your invoice.   Our billing staff will not be able to assist you with questions regarding bills from these companies.  You will be contacted with the lab results as soon as they are available. The fastest way to get your results is to activate your My Chart account. Instructions are located on the last page of this paperwork. If you have not heard from us regarding the results in 2 weeks, please contact this office.     Viral Illness, Adult Viruses are tiny germs that can get into a person's body and cause illness. There are many different types of viruses, and they cause many types of illness. Viral illnesses can range from mild to severe. They can affect various parts of the body. Common illnesses that are caused by a virus include colds and the flu. Viral illnesses also include serious conditions such as HIV/AIDS (human immunodeficiency virus/acquired immunodeficiency syndrome). A few viruses have been linked to certain cancers. What are the causes? Many types of viruses can cause illness. Viruses invade cells in your body, multiply, and cause the infected cells to malfunction or die. When the cell dies, it releases more of the virus. When this happens, you develop symptoms of the illness, and the virus continues to spread to other cells. If the virus takes over the function of the cell, it can cause the cell to divide and grow out of control, as is the case when a virus causes cancer. Different viruses get into the body in different ways. You can get a virus by:  Swallowing food or water that is contaminated with the virus.  Breathing in droplets  that have been coughed or sneezed into the air by an infected person.  Touching a surface that has been contaminated with the virus and then touching your eyes, nose, or mouth.  Being bitten by an insect or animal that carries the virus.  Having sexual contact with a person who is infected with the virus.  Being exposed to blood or fluids that contain the virus, either through an open cut or during a transfusion. If a virus enters your body, your body's defense system (immune system) will try to fight the virus. You may be at higher risk for a viral illness if your immune system is weak. What are the signs or symptoms? Symptoms vary depending on the type of virus and the location of the cells that it invades. Common symptoms of the main types of viral illnesses include: Cold and flu viruses   Fever.  Headache.  Sore throat.  Muscle aches.  Nasal congestion.  Cough. Digestive system (gastrointestinal) viruses   Fever.  Abdominal pain.  Nausea.  Diarrhea. Liver viruses (hepatitis)   Loss of appetite.  Tiredness.  Yellowing of the skin (jaundice). Brain and spinal cord viruses   Fever.  Headache.  Stiff neck.  Nausea and vomiting.  Confusion or sleepiness. Skin viruses   Warts.  Itching.  Rash. Sexually transmitted viruses   Discharge.  Swelling.  Redness.  Rash. How is this treated? Viruses can be difficult to treat because they live within cells. Antibiotic medicines do not treat viruses because these drugs do not get inside cells.   Treatment for a viral illness may include:  Resting and drinking plenty of fluids.  Medicines to relieve symptoms. These can include over-the-counter medicine for pain and fever, medicines for cough or congestion, and medicines to relieve diarrhea.  Antiviral medicines. These drugs are available only for certain types of viruses. They may help reduce flu symptoms if taken early. There are also many antiviral  medicines for hepatitis and HIV/AIDS. Some viral illnesses can be prevented with vaccinations. A common example is the flu shot. Follow these instructions at home: Medicines    Take over-the-counter and prescription medicines only as told by your health care provider.  If you were prescribed an antiviral medicine, take it as told by your health care provider. Do not stop taking the medicine even if you start to feel better.  Be aware of when antibiotics are needed and when they are not needed. Antibiotics do not treat viruses. If your health care provider thinks that you may have a bacterial infection as well as a viral infection, you may get an antibiotic.  Do not ask for an antibiotic prescription if you have been diagnosed with a viral illness. That will not make your illness go away faster.  Frequently taking antibiotics when they are not needed can lead to antibiotic resistance. When this develops, the medicine no longer works against the bacteria that it normally fights. General instructions   Drink enough fluids to keep your urine clear or pale yellow.  Rest as much as possible.  Return to your normal activities as told by your health care provider. Ask your health care provider what activities are safe for you.  Keep all follow-up visits as told by your health care provider. This is important. How is this prevented? Take these actions to reduce your risk of viral infection:  Eat a healthy diet and get enough rest.  Wash your hands often with soap and water. This is especially important when you are in public places. If soap and water are not available, use hand sanitizer.  Avoid close contact with friends and family who have a viral illness.  If you travel to areas where viral gastrointestinal infection is common, avoid drinking water or eating raw food.  Keep your immunizations up to date. Get a flu shot every year as told by your health care provider.  Do not share  toothbrushes, nail clippers, razors, or needles with other people.  Always practice safe sex. Contact a health care provider if:  You have symptoms of a viral illness that do not go away.  Your symptoms come back after going away.  Your symptoms get worse. Get help right away if:  You have trouble breathing.  You have a severe headache or a stiff neck.  You have severe vomiting or abdominal pain. This information is not intended to replace advice given to you by your health care provider. Make sure you discuss any questions you have with your health care provider. Document Released: 02/11/2016 Document Revised: 03/15/2016 Document Reviewed: 02/11/2016 Elsevier Interactive Patient Education  2017 Elsevier Inc.   

## 2017-11-12 ENCOUNTER — Other Ambulatory Visit: Payer: Self-pay

## 2017-11-12 ENCOUNTER — Ambulatory Visit: Payer: 59 | Admitting: Physician Assistant

## 2017-11-12 ENCOUNTER — Encounter: Payer: Self-pay | Admitting: Physician Assistant

## 2017-11-12 VITALS — BP 106/72 | HR 95 | Temp 99.1°F | Resp 18 | Ht 64.96 in | Wt 230.2 lb

## 2017-11-12 DIAGNOSIS — J0100 Acute maxillary sinusitis, unspecified: Secondary | ICD-10-CM

## 2017-11-12 DIAGNOSIS — J3489 Other specified disorders of nose and nasal sinuses: Secondary | ICD-10-CM | POA: Diagnosis not present

## 2017-11-12 DIAGNOSIS — J029 Acute pharyngitis, unspecified: Secondary | ICD-10-CM

## 2017-11-12 LAB — POCT CBC
HCT, POC: 37.4 % — AB (ref 37.7–47.9)
Hemoglobin: 12.2 g/dL (ref 12.2–16.2)
Lymph, poc: 1 (ref 0.6–3.4)
MCH: 25.9 pg — AB (ref 27–31.2)
MCHC: 32.7 g/dL (ref 31.8–35.4)
MCV: 79.1 fL — AB (ref 80–97)
MID (CBC): 0.4 (ref 0–0.9)
MPV: 7.6 fL (ref 0–99.8)
PLATELET COUNT, POC: 261 10*3/uL (ref 142–424)
POC Granulocyte: 3.6 (ref 2–6.9)
POC LYMPH PERCENT: 20.2 %L (ref 10–50)
POC MID %: 7.3 % (ref 0–12)
RBC: 4.72 M/uL (ref 4.04–5.48)
WBC: 5 10*3/uL (ref 4.6–10.2)

## 2017-11-12 LAB — POCT RAPID STREP A (OFFICE): Rapid Strep A Screen: NEGATIVE

## 2017-11-12 MED ORDER — DOXYCYCLINE HYCLATE 100 MG PO CAPS: 100.0000 mg | ORAL_CAPSULE | Freq: Two times a day (BID) | ORAL | 0 refills | Status: DC

## 2017-11-12 MED ORDER — PREDNISONE 20 MG PO TABS
ORAL_TABLET | ORAL | 0 refills | Status: DC
Start: 2017-11-12 — End: 2018-04-12

## 2017-11-12 NOTE — Patient Instructions (Addendum)
We are going to treat you with an additional antibiotic and prednisone. Prednisone is a steroid and can cause side effects such as headache, irritability, nausea, vomiting, increased heart rate, increased blood pressure, increased blood sugar, appetite changes, and insomnia. Please take tablets in the morning with a full meal to help decrease the chances of these side effects.  Also recommended doing salt water gargles and nasal saline rinses daily. Return to clinic if symptoms worsen, do not improve, or as needed.   Sinusitis, Adult Sinusitis is soreness and inflammation of your sinuses. Sinuses are hollow spaces in the bones around your face. They are located:  Around your eyes.  In the middle of your forehead.  Behind your nose.  In your cheekbones.  Your sinuses and nasal passages are lined with a stringy fluid (mucus). Mucus normally drains out of your sinuses. When your nasal tissues get inflamed or swollen, the mucus can get trapped or blocked so air cannot flow through your sinuses. This lets bacteria, viruses, and funguses grow, and that leads to infection. Follow these instructions at home: Medicines  Take, use, or apply over-the-counter and prescription medicines only as told by your doctor. These may include nasal sprays.  If you were prescribed an antibiotic medicine, take it as told by your doctor. Do not stop taking the antibiotic even if you start to feel better. Hydrate and Humidify  Drink enough water to keep your pee (urine) clear or pale yellow.  Use a cool mist humidifier to keep the humidity level in your home above 50%.  Breathe in steam for 10-15 minutes, 3-4 times a day or as told by your doctor. You can do this in the bathroom while a hot shower is running.  Try not to spend time in cool or dry air. Rest  Rest as much as possible.  Sleep with your head raised (elevated).  Make sure to get enough sleep each night. General instructions  Put a warm,  moist washcloth on your face 3-4 times a day or as told by your doctor. This will help with discomfort.  Wash your hands often with soap and water. If there is no soap and water, use hand sanitizer.  Do not smoke. Avoid being around people who are smoking (secondhand smoke).  Keep all follow-up visits as told by your doctor. This is important. Contact a doctor if:  You have a fever.  Your symptoms get worse.  Your symptoms do not get better within 10 days. Get help right away if:  You have a very bad headache.  You cannot stop throwing up (vomiting).  You have pain or swelling around your face or eyes.  You have trouble seeing.  You feel confused.  Your neck is stiff.  You have trouble breathing. This information is not intended to replace advice given to you by your health care provider. Make sure you discuss any questions you have with your health care provider. Document Released: 03/20/2008 Document Revised: 05/28/2016 Document Reviewed: 07/28/2015 Elsevier Interactive Patient Education  2018 [employer].   IF you received an x-ray today, you will receive an invoice from [facility]. Please contact [facility] at [phone] with questions or concerns regarding your invoice.   IF you received labwork today, you will receive an invoice from [facility]. Please contact [facility] at [phone] with questions or concerns regarding your invoice.   Our billing staff will not be able to assist you with questions regarding bills from these companies.  You will be  contacted with the lab results as soon as they are available. The fastest way to get your results is to activate your My Chart account. Instructions are located on the last page of this paperwork. If you have not heard from [place] regarding the results in 2 weeks, please contact this office.

## 2017-11-12 NOTE — Progress Notes (Signed)
MRN: [id number] DOB: 26-Nov-1970  Subjective:   Paula Horne is a 47 y.o. female presenting for chief complaint of Sore Throat (X 2 weeks- bilateral ear pain ); Cough (X 2 weeks); and Hoarse (X 2 days) .  Reports 2 week history of sore throat, sinus pressure, hoarse voice, ear pain, and cough that is coming from post nasal drip.  Has also had a low grade fever. Did an Evisit on 11/02/17 and was given Rx for 10 days worth of augmentin, mucinex, and zyrtec. Has tried all of the prescribed medication, sudafed, and tylenol with no full relief.  Notes she initially felt better after starting the abx but then felt bad again. Denies inability swallowing, difficulty swallowing, wheezing, shortness of breath and chest pain, nausea, vomiting, abdominal pain and diarrhea. Has had sick contact with daughter, who had flu.Denies history of seasonal allergies, asthma, COPD, and diabetes. Patient has not had flu shot this season. Denies smoking. Denies any other aggravating or relieving factors, no other questions or concerns.  Review of Systems  Eyes: Negative for blurred vision and double vision.  Neurological: Negative for dizziness and tingling.  Psychiatric/Behavioral: Negative for memory loss.    Paula Horne has a current medication list which includes the following prescription(s): pseudoephedrine, doxycycline, guaifenesin-codeine, hydrocodone-acetaminophen, ipratropium, ondansetron, and prednisone. Also is allergic to sulfonamide derivatives and sulfa antibiotics.  Paula Horne  has a past medical history of Acid reflux, Anemia, Cancer of skin of back, Depression, GERD (gastroesophageal reflux disease), Migraine, PONV (postoperative nausea and vomiting), and Sinusitis. Also  has a past surgical history that includes Cholecystectomy (2001) and Esophageal dilation (04/2007).   Objective:   Vitals: BP 106/72 (BP Location: Left Arm, Patient Position: Sitting, Cuff Size: Large)   Pulse 95   Temp 99.1 F  (37.3 C) (Oral)   Resp 18   Ht 5' 4.96" (1.65 m)   Wt 230 lb 3.2 oz (104.4 kg)   LMP 10/19/2017 (Approximate)   SpO2 100%   BMI 38.35 kg/m   Physical Exam  Constitutional: She is oriented to person, place, and time. She appears well-developed and well-nourished.  Pt appears like she does not feel well. Hoarse voice noted.  HENT:  Head: Normocephalic and atraumatic. Head is without right periorbital erythema and without left periorbital erythema.  Right Ear: External ear and ear canal normal. Tympanic membrane is not erythematous and not bulging. A middle ear effusion is present.  Left Ear: External ear and ear canal normal. Tympanic membrane is not erythematous and not bulging. A middle ear effusion is present.  Nose: Mucosal edema and rhinorrhea present. Right sinus exhibits maxillary sinus tenderness. Right sinus exhibits no frontal sinus tenderness. Left sinus exhibits maxillary sinus tenderness. Left sinus exhibits no frontal sinus tenderness.  Mouth/Throat: Uvula is midline and mucous membranes are normal. No uvula swelling. Posterior oropharyngeal erythema present. Tonsils are 2+ on the right. Tonsils are 2+ on the left. No tonsillar exudate.  Eyes: Conjunctivae are normal.  Neck: Normal range of motion.  Pulmonary/Chest: Effort normal and breath sounds normal. She has no wheezes. She has no rhonchi. She has no rales.  Lymphadenopathy:       Head (right side): No submental, no submandibular, no tonsillar, no preauricular, no posterior auricular and no occipital adenopathy present.       Head (left side): No submental, no submandibular, no tonsillar, no preauricular, no posterior auricular and no occipital adenopathy present.    She has no cervical adenopathy.  Right: No supraclavicular adenopathy present.       Left: No supraclavicular adenopathy present.  Neurological: She is alert and oriented to person, place, and time.  Skin: Skin is warm and dry.  Psychiatric: She has a  normal mood and affect.  Vitals reviewed.   Results for orders placed or performed in visit on 11/12/17 (from the past 24 hour(s))  POCT rapid strep A     Status: Normal   Collection Time: 11/12/17 12:27 PM  Result Value Ref Range   Rapid Strep A Screen Negative Negative  POCT CBC     Status: Abnormal   Collection Time: 11/12/17  1:09 PM  Result Value Ref Range   WBC 5.0 4.6 - 10.2 K/uL   Lymph, poc 1.0 0.6 - 3.4   POC LYMPH PERCENT 20.2 10 - 50 %L   MID (cbc) 0.4 0 - 0.9   POC MID % 7.3 0 - 12 %M   POC Granulocyte 3.6 2 - 6.9   Granulocyte percent  37 - 80 %G   RBC 4.72 4.04 - 5.48 M/uL   Hemoglobin 12.2 12.2 - 16.2 g/dL   HCT, POC 37.4 (A) 37.7 - 47.9 %   MCV 79.1 (A) 80 - 97 fL   MCH, POC 25.9 (A) 27 - 31.2 pg   MCHC 32.7 31.8 - 35.4 g/dL   RDW, POC  %   Platelet Count, POC 261 142 - 424 K/uL   MPV 7.6 0 - 99.8 fL    Assessment and Plan :  1. Sore throat - POCT rapid strep A - POCT CBC - Culture, Group A Strep 2. Acute pharyngitis, unspecified etiology 3. Sinus pressure 4. Acute maxillary sinusitis, recurrence not specified Pt still symptomatic despite course of augmentin. PE findings show tender maxillary sinuses, moderately swollen tonsils, and hoarse voice. No red flags noted. Will attempt 2nd abx at this time and oral prednisone. Educated on potential side effects of medication. Encouraged pt to start nasal saline rinse and salt water gargles daily. Advised to return to clinic if symptoms worsen, do not improve, or as needed.  - predniSONE (DELTASONE) 20 MG tablet; Days 1-5: Take 40mg  in am with food. Days 6-10: Take 20mg  in am with food.  Dispense: 15 tablet; Refill: 0 - doxycycline (VIBRAMYCIN) 100 MG capsule; Take 1 capsule (100 mg total) by mouth 2 (two) times daily.  Dispense: 20 capsule; Refill: 0   Tenna Delaine, PA-C  [facility] 11/12/2017 1:55 PM

## 2017-11-14 LAB — CULTURE, GROUP A STREP: Strep A Culture: NEGATIVE

## 2018-04-12 ENCOUNTER — Encounter: Payer: Self-pay | Admitting: Physician Assistant

## 2018-04-12 ENCOUNTER — Other Ambulatory Visit: Payer: Self-pay

## 2018-04-12 ENCOUNTER — Encounter: Payer: 59 | Admitting: Physician Assistant

## 2018-04-12 ENCOUNTER — Ambulatory Visit (INDEPENDENT_AMBULATORY_CARE_PROVIDER_SITE_OTHER): Payer: 59 | Admitting: Physician Assistant

## 2018-04-12 VITALS — BP 100/72 | HR 91 | Temp 98.2°F | Ht 64.0 in | Wt 229.6 lb

## 2018-04-12 DIAGNOSIS — Z111 Encounter for screening for respiratory tuberculosis: Secondary | ICD-10-CM

## 2018-04-12 DIAGNOSIS — Z13228 Encounter for screening for other metabolic disorders: Secondary | ICD-10-CM

## 2018-04-12 DIAGNOSIS — Z1329 Encounter for screening for other suspected endocrine disorder: Secondary | ICD-10-CM

## 2018-04-12 DIAGNOSIS — Z0000 Encounter for general adult medical examination without abnormal findings: Secondary | ICD-10-CM

## 2018-04-12 DIAGNOSIS — Z130 Encounter for screening for diseases of the blood and blood-forming organs and certain disorders involving the immune mechanism: Secondary | ICD-10-CM

## 2018-04-12 LAB — POCT URINALYSIS DIP (MANUAL ENTRY)
Glucose, UA: NEGATIVE mg/dL
Nitrite, UA: NEGATIVE
Protein Ur, POC: 30 mg/dL — AB
Spec Grav, UA: >=1.03 — AB (ref 1.010–1.025)
Urobilinogen, UA: 1 E.U./dL
pH, UA: 5.5 (ref 5.0–8.0)

## 2018-04-12 NOTE — Patient Instructions (Addendum)
We can fax your TB results if needed - let use know.   We recommend that you schedule a mammogram for breast cancer screening. Typically, you do not need a referral to do this. Please contact a local imaging center to schedule your mammogram.  [facility] ([place]) - [phone] or [phone]  [place] [phone] [facility] - [phone]  Health Maintenance, Female Adopting a healthy lifestyle and getting preventive care can go a long way to promote health and wellness. Talk with your health care provider about what schedule of regular examinations is right for you. This is a good chance for you to check in with your provider about disease prevention and staying healthy. In between checkups, there are plenty of things you can do on your own. Experts have done a lot of research about which lifestyle changes and preventive measures are most likely to keep you healthy. Ask your health care provider for more information. Weight and diet Eat a healthy diet  Be sure to include plenty of vegetables, fruits, low-fat dairy products, and lean protein.  Do not eat a lot of foods high in solid fats, added sugars, or salt.  Get regular exercise. This is one of the most important things you can do for your health. ? Most adults should exercise for at least 150 minutes each week. The exercise should increase your heart rate and make you sweat (moderate-intensity exercise). ? Most adults should also do strengthening exercises at least twice a week. This is in addition to the moderate-intensity exercise.  Maintain a healthy weight  Body mass index (BMI) is a measurement that can be used to identify possible weight problems. It estimates body fat based on height and weight. Your health care provider can help determine your BMI and help you achieve or maintain a healthy weight.  For females 42 years of age and older: ? A BMI below 18.5 is considered  underweight. ? A BMI of 18.5 to 24.9 is normal. ? A BMI of 25 to 29.9 is considered overweight. ? A BMI of 30 and above is considered obese.  Watch levels of cholesterol and blood lipids  You should start having your blood tested for lipids and cholesterol at 47 years of age, then have this test every 5 years.  You may need to have your cholesterol levels checked more often if: ? Your lipid or cholesterol levels are high. ? You are older than 47 years of age. ? You are at high risk for heart disease.  Cancer screening Lung Cancer  Lung cancer screening is recommended for adults 9-18 years old who are at high risk for lung cancer because of a history of smoking.  A yearly low-dose CT scan of the lungs is recommended for people who: ? Currently smoke. ? Have quit within the past 15 years. ? Have at least a 30-pack-year history of smoking. A pack year is smoking an average of one pack of cigarettes a day for 1 year.  Yearly screening should continue until it has been 15 years since you quit.  Yearly screening should stop if you develop a health problem that would prevent you from having lung cancer treatment.  Breast Cancer  Practice breast self-awareness. This means understanding how your breasts normally appear and feel.  It also means doing regular breast self-exams. Let your health care provider know about any changes, no matter how small.  If you are in your 20s or 30s, you  should have a clinical breast exam (CBE) by a health care provider every 1-3 years as part of a regular health exam.  If you are 97 or older, have a CBE every year. Also consider having a breast X-ray (mammogram) every year.  If you have a family history of breast cancer, talk to your health care provider about genetic screening.  If you are at high risk for breast cancer, talk to your health care provider about having an MRI and a mammogram every year.  Breast cancer gene (BRCA) assessment is  recommended for women who have family members with BRCA-related cancers. BRCA-related cancers include: ? Breast. ? Ovarian. ? Tubal. ? Peritoneal cancers.  Results of the assessment will determine the need for genetic counseling and BRCA1 and BRCA2 testing.  Cervical Cancer Your health care provider may recommend that you be screened regularly for cancer of the pelvic organs (ovaries, uterus, and vagina). This screening involves a pelvic examination, including checking for microscopic changes to the surface of your cervix (Pap test). You may be encouraged to have this screening done every 3 years, beginning at age 86.  For women ages 56-65, health care providers may recommend pelvic exams and Pap testing every 3 years, or they may recommend the Pap and pelvic exam, combined with testing for human papilloma virus (HPV), every 5 years. Some types of HPV increase your risk of cervical cancer. Testing for HPV may also be done on women of any age with unclear Pap test results.  Other health care providers may not recommend any screening for nonpregnant women who are considered low risk for pelvic cancer and who do not have symptoms. Ask your health care provider if a screening pelvic exam is right for you.  If you have had past treatment for cervical cancer or a condition that could lead to cancer, you need Pap tests and screening for cancer for at least 20 years after your treatment. If Pap tests have been discontinued, your risk factors (such as having a new sexual partner) need to be reassessed to determine if screening should resume. Some women have medical problems that increase the chance of getting cervical cancer. In these cases, your health care provider may recommend more frequent screening and Pap tests.  Colorectal Cancer  This type of cancer can be detected and often prevented.  Routine colorectal cancer screening usually begins at 47 years of age and continues through 47 years of  age.  Your health care provider may recommend screening at an earlier age if you have risk factors for colon cancer.  Your health care provider may also recommend using home test kits to check for hidden blood in the stool.  A small camera at the end of a tube can be used to examine your colon directly (sigmoidoscopy or colonoscopy). This is done to check for the earliest forms of colorectal cancer.  Routine screening usually begins at age 45.  Direct examination of the colon should be repeated every 5-10 years through 47 years of age. However, you may need to be screened more often if early forms of precancerous polyps or small growths are found.  Skin Cancer  Check your skin from head to toe regularly.  Tell your health care provider about any new moles or changes in moles, especially if there is a change in a mole's shape or color.  Also tell your health care provider if you have a mole that is larger than the size of a pencil eraser.  Always use sunscreen. Apply sunscreen liberally and repeatedly throughout the day.  Protect yourself by wearing long sleeves, pants, a wide-brimmed hat, and sunglasses whenever you are outside.  Heart disease, diabetes, and high blood pressure  High blood pressure causes heart disease and increases the risk of stroke. High blood pressure is more likely to develop in: ? People who have blood pressure in the high end of the normal range (130-139/85-89 mm Hg). ? People who are overweight or obese. ? People who are African American.  If you are 18-39 years of age, have your blood pressure checked every 3-5 years. If you are 40 years of age or older, have your blood pressure checked every year. You should have your blood pressure measured twice-once when you are at a hospital or clinic, and once when you are not at a hospital or clinic. Record the average of the two measurements. To check your blood pressure when you are not at a hospital or clinic, you  can use: ? An automated blood pressure machine at a pharmacy. ? A home blood pressure monitor.  If you are between 55 years and 79 years old, ask your health care provider if you should take aspirin to prevent strokes.  Have regular diabetes screenings. This involves taking a blood sample to check your fasting blood sugar level. ? If you are at a normal weight and have a low risk for diabetes, have this test once every three years after 47 years of age. ? If you are overweight and have a high risk for diabetes, consider being tested at a younger age or more often. Preventing infection Hepatitis B  If you have a higher risk for hepatitis B, you should be screened for this virus. You are considered at high risk for hepatitis B if: ? You were born in a country where hepatitis B is common. Ask your health care provider which countries are considered high risk. ? Your parents were born in a high-risk country, and you have not been immunized against hepatitis B (hepatitis B vaccine). ? You have HIV or AIDS. ? You use needles to inject street drugs. ? You live with someone who has hepatitis B. ? You have had sex with someone who has hepatitis B. ? You get hemodialysis treatment. ? You take certain medicines for conditions, including cancer, organ transplantation, and autoimmune conditions.  Hepatitis C  Blood testing is recommended for: ? Everyone born from 1945 through 1965. ? Anyone with known risk factors for hepatitis C.  Sexually transmitted infections (STIs)  You should be screened for sexually transmitted infections (STIs) including gonorrhea and chlamydia if: ? You are sexually active and are younger than 47 years of age. ? You are older than 47 years of age and your health care provider tells you that you are at risk for this type of infection. ? Your sexual activity has changed since you were last screened and you are at an increased risk for chlamydia or gonorrhea. Ask your  health care provider if you are at risk.  If you do not have HIV, but are at risk, it may be recommended that you take a prescription medicine daily to prevent HIV infection. This is called pre-exposure prophylaxis (PrEP). You are considered at risk if: ? You are sexually active and do not regularly use condoms or know the HIV status of your partner(s). ? You take drugs by injection. ? You are sexually active with a partner who has HIV.  Talk with   your health care provider about whether you are at high risk of being infected with HIV. If you choose to begin PrEP, you should first be tested for HIV. You should then be tested every 3 months for as long as you are taking PrEP. Pregnancy  If you are premenopausal and you may become pregnant, ask your health care provider about preconception counseling.  If you may become pregnant, take 400 to 800 micrograms (mcg) of folic acid every day.  If you want to prevent pregnancy, talk to your health care provider about birth control (contraception). Osteoporosis and menopause  Osteoporosis is a disease in which the bones lose minerals and strength with aging. This can result in serious bone fractures. Your risk for osteoporosis can be identified using a bone density scan.  If you are 50 years of age or older, or if you are at risk for osteoporosis and fractures, ask your health care provider if you should be screened.  Ask your health care provider whether you should take a calcium or vitamin D supplement to lower your risk for osteoporosis.  Menopause may have certain physical symptoms and risks.  Hormone replacement therapy may reduce some of these symptoms and risks. Talk to your health care provider about whether hormone replacement therapy is right for you. Follow these instructions at home:  Schedule regular health, dental, and eye exams.  Stay current with your immunizations.  Do not use any tobacco products including cigarettes, chewing  tobacco, or electronic cigarettes.  If you are pregnant, do not drink alcohol.  If you are breastfeeding, limit how much and how often you drink alcohol.  Limit alcohol intake to no more than 1 drink per day for nonpregnant women. One drink equals 12 ounces of beer, 5 ounces of wine, or 1 ounces of hard liquor.  Do not use street drugs.  Do not share needles.  Ask your health care provider for help if you need support or information about quitting drugs.  Tell your health care provider if you often feel depressed.  Tell your health care provider if you have ever been abused or do not feel safe at home. This information is not intended to replace advice given to you by your health care provider. Make sure you discuss any questions you have with your health care provider. Document Released: 04/17/2011 Document Revised: 03/09/2016 Document Reviewed: 07/06/2015 Elsevier Interactive Patient Education  [employer].  Thank you for coming in today. I hope you feel we met your needs.  Feel free to call PCP if you have any questions or further requests.  Please consider signing up for MyChart if you do not already have it, as this is a great way to communicate with me.  Best,  Whitney McVey, PA-C  IF you received an x-ray today, you will receive an invoice from [facility]. Please contact [facility] at [phone] with questions or concerns regarding your invoice.   IF you received labwork today, you will receive an invoice from [facility]. Please contact [facility] at [phone] with questions or concerns regarding your invoice.   Our billing staff will not be able to assist you with questions regarding bills from these companies.  You will be contacted with the lab results as soon as they are available. The fastest way to get your results is to activate your My Chart account. Instructions are located on the last page of this paperwork. If you have not heard from [place]  regarding the results in 2  weeks, please contact this office.

## 2018-04-12 NOTE — Progress Notes (Signed)
[facility] [place], [place] [address] [phone] [phone]  Date:  04/12/2018   Name:  Paula Horne   DOB:  1970/11/29   MRN:  [id number]  PCP:  Patient, No Pcp Per    Chief Complaint: Annual Exam (form to fill out for work, no pap)   History of Present Illness:  This is a 47 y.o. female who  has a past medical history of Acid reflux, Anemia, Cancer of skin of back, Depression, GERD (gastroesophageal reflux disease), Migraine, Migraines, PONV (postoperative nausea and vomiting), and Sinusitis. who is presenting for CPE. She needs form filled out for work.   Complaints: none LMP: 03/16/2018 Contraception: none Last pap: one year ago Sexual history: she is sexually active.  Immunizations: utd Dentist: q yearly.  Eye:  20/25 b/l. Wears glasses. q yearly check-ups.  Diet/Exercise: walks regularly. Cooks at home.  Fam hx: family history includes Alcohol abuse in her brother, maternal grandfather, and maternal grandmother; Arthritis in her mother; Colon cancer in her maternal uncle; Coronary artery disease in her paternal grandmother; Depression in her father and mother; Ovarian cancer in her paternal grandmother; Skin cancer in her father; Stroke in her maternal grandmother. Tobacco/alcohol/substance use: Never Smoker, no drug use.   Mammogram: 04/2013. Negative.   Review of Systems:  Review of Systems  Constitutional: Negative for chills, diaphoresis, fatigue and fever.  HENT: Negative for congestion, postnasal drip, rhinorrhea, sinus pressure, sneezing and sore throat.   Respiratory: Negative for cough, chest tightness, shortness of breath and wheezing.   Cardiovascular: Negative for chest pain and palpitations.  Gastrointestinal: Negative for abdominal pain, diarrhea, nausea and vomiting.  Endocrine: Negative for polydipsia, polyphagia and polyuria.  Genitourinary: Negative for decreased urine volume, difficulty urinating, dysuria, enuresis, flank pain, frequency,  hematuria, urgency, vaginal bleeding, vaginal discharge and vaginal pain.  Musculoskeletal: Negative for back pain.  Neurological: Negative for dizziness, weakness, light-headedness and headaches.    Patient Active Problem List   Diagnosis Date Noted  . Viral illness 12/18/2016  . Non-intractable vomiting with nausea 12/18/2016  . Major depressive disorder, recurrent episode, severe, without mention of psychotic behavior 08/05/2013  . Suicide attempt by acetaminophen overdose ([place]) 08/05/2013  . FATIGUE 11/01/2009  . SLEEP DISORDER, CHRONIC 11/03/2008  . HEMANGIOMA, HEPATIC 01/28/2008  . ESOPHAGEAL STRICTURE 01/28/2008  . DYSPEPSIA 01/28/2008  . DEPRESSIVE DISORDER NOT ELSEWHERE CLASSIFIED 01/27/2008  . GERD 01/27/2008  . MIGRAINE 08/12/2007    Prior to Admission medications   Medication Sig Start Date End Date Taking? Authorizing Provider  ipratropium (ATROVENT) 0.03 % nasal spray Place 2 sprays into the nose 4 (four) times daily. Patient not taking: Reported on 12/18/2016 11/17/15   Shawnee Knapp, MD    Allergies  Allergen Reactions  . Sulfonamide Derivatives     REACTION: diffuse rash; no Kathreen Cosier Syndrome  phenomena  . Sulfa Antibiotics Rash    Past Surgical History:  Procedure Laterality Date  . CHOLECYSTECTOMY  2001  . ESOPHAGEAL DILATION  04/2007   Dr.Kaplan  . middle right toe surgery      Social History   Tobacco Use  . Smoking status: Never Smoker  . Smokeless tobacco: Never Used  Substance Use Topics  . Alcohol use: No  . Drug use: No    Family History  Problem Relation Age of Onset  . Depression Father   . Skin cancer Father   . Colon cancer Maternal Uncle   . Depression Mother   . Arthritis Mother        ?  type  . Alcohol abuse Brother   . Alcohol abuse Maternal Grandmother   . Stroke Maternal Grandmother   . Alcohol abuse Maternal Grandfather   . Coronary artery disease Paternal Grandmother   . Ovarian cancer Paternal Grandmother      Medication list has been reviewed and updated.  Physical Examination:  Physical Exam  Constitutional: She is oriented to person, place, and time. She appears well-developed and well-nourished. No distress.  HENT:  Head: Normocephalic and atraumatic.  Mouth/Throat: Oropharynx is clear and moist.  Eyes: Pupils are equal, round, and reactive to light. Conjunctivae and EOM are normal.  Neck: Normal range of motion. Neck supple. No thyromegaly present.  Cardiovascular: Normal rate, regular rhythm and normal heart sounds.  No murmur heard. Pulmonary/Chest: Effort normal and breath sounds normal. She has no wheezes.  Abdominal: Soft. There is no tenderness.  Musculoskeletal: Normal range of motion.  Neurological: She is alert and oriented to person, place, and time. She has normal reflexes.  Skin: Skin is warm and dry.  Psychiatric: She has a normal mood and affect. Her behavior is normal. Judgment and thought content normal.  Vitals reviewed.   BP 100/72 (BP Location: Left Arm, Patient Position: Sitting, Cuff Size: Normal)   Pulse 91   Temp 98.2 F (36.8 C) (Oral)   Ht '5\' 4"'  (1.626 m)   Wt 229 lb 9.6 oz (104.1 kg)   LMP 03/16/2018   SpO2 97%   BMI 39.41 kg/m   Assessment and Plan: 1. Annual physical exam -Patient presents for annual exam.  She is feeling well today.  Biometric form filled out.  TB result is still pending -she cannot come back for skin test results so serum test done today.  She is up-to-date on her Pap.  Lab results are pending, will contact with results.  Routine health maintenance discussed  2. Screening for endocrine, metabolic and immunity disorder - POCT urinalysis dipstick - Lipid panel - CBC with Differential/Platelet - CMP14+EGFR - TSH - VITAMIN D 25 Hydroxy (Vit-D Deficiency, Fractures) - Hemoglobin A1c  3. Screening-pulmonary TB - QuantiFERON-TB Gold Plus   Mercer Pod, PA-C  [facility] 04/12/2018  9:37 AM

## 2018-04-13 LAB — CBC WITH DIFFERENTIAL/PLATELET
Basophils Absolute: 0 10*3/uL (ref 0.0–0.2)
Basos: 0 %
EOS (ABSOLUTE): 0.1 10*3/uL (ref 0.0–0.4)
Eos: 2 %
Hematocrit: 37.9 % (ref 34.0–46.6)
Hemoglobin: 12.5 g/dL (ref 11.1–15.9)
Immature Grans (Abs): 0 10*3/uL (ref 0.0–0.1)
Immature Granulocytes: 0 %
Lymphocytes Absolute: 1.7 10*3/uL (ref 0.7–3.1)
Lymphs: 24 %
MCH: 26 pg — ABNORMAL LOW (ref 26.6–33.0)
MCHC: 33 g/dL (ref 31.5–35.7)
MCV: 79 fL (ref 79–97)
Monocytes Absolute: 0.4 10*3/uL (ref 0.1–0.9)
Monocytes: 6 %
Neutrophils Absolute: 5 10*3/uL (ref 1.4–7.0)
Neutrophils: 68 %
Platelets: 361 10*3/uL (ref 150–450)
RBC: 4.8 x10E6/uL (ref 3.77–5.28)
RDW: 14 % (ref 12.3–15.4)
WBC: 7.2 10*3/uL (ref 3.4–10.8)

## 2018-04-13 LAB — CMP14+EGFR
ALT: 8 IU/L (ref 0–32)
AST: 10 IU/L (ref 0–40)
Albumin/Globulin Ratio: 1.5 (ref 1.2–2.2)
Albumin: 3.9 g/dL (ref 3.5–5.5)
Alkaline Phosphatase: 122 IU/L — ABNORMAL HIGH (ref 39–117)
BUN/Creatinine Ratio: 12 (ref 9–23)
BUN: 8 mg/dL (ref 6–24)
Bilirubin Total: 0.3 mg/dL (ref 0.0–1.2)
CO2: 23 mmol/L (ref 20–29)
Calcium: 9.1 mg/dL (ref 8.7–10.2)
Chloride: 102 mmol/L (ref 96–106)
Creatinine, Ser: 0.69 mg/dL (ref 0.57–1.00)
GFR calc Af Amer: 121 mL/min/{1.73_m2} (ref >59)
GFR calc non Af Amer: 105 mL/min/{1.73_m2} (ref >59)
Globulin, Total: 2.6 g/dL (ref 1.5–4.5)
Glucose: 95 mg/dL (ref 65–99)
Potassium: 3.9 mmol/L (ref 3.5–5.2)
Sodium: 141 mmol/L (ref 134–144)
Total Protein: 6.5 g/dL (ref 6.0–8.5)

## 2018-04-13 LAB — VITAMIN D 25 HYDROXY (VIT D DEFICIENCY, FRACTURES): Vit D, 25-Hydroxy: 20.2 ng/mL — ABNORMAL LOW (ref 30.0–100.0)

## 2018-04-13 LAB — LIPID PANEL
Chol/HDL Ratio: 4.3 ratio (ref 0.0–4.4)
Cholesterol, Total: 169 mg/dL (ref 100–199)
HDL: 39 mg/dL — ABNORMAL LOW (ref >39)
LDL Calculated: 102 mg/dL — ABNORMAL HIGH (ref 0–99)
Triglycerides: 140 mg/dL (ref 0–149)
VLDL Cholesterol Cal: 28 mg/dL (ref 5–40)

## 2018-04-13 LAB — HEMOGLOBIN A1C
Est. average glucose Bld gHb Est-mCnc: 117 mg/dL
Hgb A1c MFr Bld: 5.7 % — ABNORMAL HIGH (ref 4.8–5.6)

## 2018-04-13 LAB — TSH: TSH: 3.86 u[IU]/mL (ref 0.450–4.500)

## 2018-04-17 LAB — QUANTIFERON-TB GOLD PLUS
QuantiFERON Mitogen Value: >10 IU/mL
QuantiFERON Nil Value: 0.07 IU/mL
QuantiFERON TB1 Ag Value: 0.05 IU/mL
QuantiFERON TB2 Ag Value: 0.06 IU/mL
QuantiFERON-TB Gold Plus: NEGATIVE
